# Patient Record
Sex: Male | Born: 1985
Health system: Southern US, Community
[De-identification: ages and names within clinical notes are randomized; demographics above are authoritative.]

## PROBLEM LIST (undated history)

## (undated) DIAGNOSIS — F209 Schizophrenia, unspecified: Secondary | ICD-10-CM

## (undated) HISTORY — PX: ELBOW SURGERY: SHX618

---

## 2011-09-24 DIAGNOSIS — Z9189 Other specified personal risk factors, not elsewhere classified: Secondary | ICD-10-CM | POA: Insufficient documentation

## 2011-09-24 DIAGNOSIS — Z8659 Personal history of other mental and behavioral disorders: Secondary | ICD-10-CM | POA: Insufficient documentation

## 2011-10-13 DIAGNOSIS — G723 Periodic paralysis: Secondary | ICD-10-CM | POA: Insufficient documentation

## 2011-10-13 DIAGNOSIS — R631 Polydipsia: Secondary | ICD-10-CM | POA: Insufficient documentation

## 2011-10-22 DIAGNOSIS — F200 Paranoid schizophrenia: Secondary | ICD-10-CM | POA: Insufficient documentation

## 2013-07-20 DIAGNOSIS — J00 Acute nasopharyngitis [common cold]: Secondary | ICD-10-CM | POA: Diagnosis not present

## 2014-02-14 DIAGNOSIS — Z23 Encounter for immunization: Secondary | ICD-10-CM | POA: Diagnosis not present

## 2014-07-22 DIAGNOSIS — G44219 Episodic tension-type headache, not intractable: Secondary | ICD-10-CM | POA: Diagnosis not present

## 2014-08-09 DIAGNOSIS — H04123 Dry eye syndrome of bilateral lacrimal glands: Secondary | ICD-10-CM | POA: Diagnosis not present

## 2014-12-25 ENCOUNTER — Encounter (HOSPITAL_COMMUNITY): Payer: Self-pay | Admitting: Emergency Medicine

## 2014-12-25 ENCOUNTER — Emergency Department (HOSPITAL_COMMUNITY)
Admission: EM | Admit: 2014-12-25 | Discharge: 2014-12-25 | Disposition: A | Discharge status: Home / Self Care | Payer: Medicare Other | Attending: Emergency Medicine | Admitting: Emergency Medicine

## 2014-12-25 ENCOUNTER — Emergency Department (HOSPITAL_COMMUNITY): Payer: Medicare Other

## 2014-12-25 DIAGNOSIS — S83012A Lateral subluxation of left patella, initial encounter: Secondary | ICD-10-CM | POA: Insufficient documentation

## 2014-12-25 DIAGNOSIS — Y9289 Other specified places as the place of occurrence of the external cause: Secondary | ICD-10-CM | POA: Diagnosis not present

## 2014-12-25 DIAGNOSIS — X58XXXA Exposure to other specified factors, initial encounter: Secondary | ICD-10-CM | POA: Insufficient documentation

## 2014-12-25 DIAGNOSIS — S83002A Unspecified subluxation of left patella, initial encounter: Secondary | ICD-10-CM

## 2014-12-25 DIAGNOSIS — Y998 Other external cause status: Secondary | ICD-10-CM | POA: Diagnosis not present

## 2014-12-25 DIAGNOSIS — S8992XA Unspecified injury of left lower leg, initial encounter: Secondary | ICD-10-CM | POA: Diagnosis present

## 2014-12-25 DIAGNOSIS — M25562 Pain in left knee: Secondary | ICD-10-CM

## 2014-12-25 DIAGNOSIS — Y9389 Activity, other specified: Secondary | ICD-10-CM | POA: Diagnosis not present

## 2014-12-25 MED ORDER — OXYCODONE-ACETAMINOPHEN 5-325 MG PO TABS
2.0000 | ORAL_TABLET | Freq: Once | ORAL | Status: DC
Start: 2014-12-25 — End: 2014-12-25
  Filled 2014-12-25: qty 2

## 2014-12-25 MED ORDER — HYDROCODONE-ACETAMINOPHEN 5-325 MG PO TABS: 1.0000 | ORAL_TABLET | Freq: Four times a day (QID) | ORAL | Status: DC | PRN

## 2014-12-25 NOTE — Discharge Instructions (Signed)

## 2014-12-25 NOTE — ED Provider Notes (Signed)
CSN: [id number]     Arrival date & time 12/25/14  5784 History   First MD Initiated Contact with Patient 12/25/14 [phone]     Chief Complaint  Patient presents with  . Knee Pain     (Consider location/radiation/quality/duration/timing/severity/associated sxs/prior Treatment) HPI Comments: Patient presents to the emergency department with chief complaint of left knee pain. Patient states that his kneecap disc locates from time to time. States that he rolled over in bed, and felt his knee pop. Began having pain after this incident. States he is normally able to "pop his knee back in place" but was unable to. States that the pain is moderate to severe. He is unable to ambulate. He has not taken anything to alleviate his symptoms. The pain does not radiate.  The history is provided by the patient. No language interpreter was used.    History reviewed. No pertinent past medical history. History reviewed. No pertinent past surgical history. No family history on file. History  Substance Use Topics  . Smoking status: Never Smoker   . Smokeless tobacco: Not on file  . Alcohol Use: No    Review of Systems  Constitutional: Negative for fever and chills.  Respiratory: Negative for shortness of breath.   Cardiovascular: Negative for chest pain.  Gastrointestinal: Negative for nausea, vomiting, diarrhea and constipation.  Genitourinary: Negative for dysuria.  Musculoskeletal: Positive for arthralgias.      Allergies  Bee venom  Home Medications   Prior to Admission medications   Medication Sig Start Date End Date Taking? Authorizing Provider  HYDROcodone-acetaminophen (NORCO/VICODIN) 5-325 MG per tablet Take 1-2 tablets by mouth every 6 (six) hours as needed. 12/25/14   Roxy Horseman, PA-C   BP 146/78 mmHg  Pulse 89  Temp(Src) 98.7 F (37.1 C) (Oral)  Resp 17  SpO2 99% Physical Exam  Constitutional: He is oriented to person, place, and time. He appears well-developed and  well-nourished.  HENT:  Head: Normocephalic and atraumatic.  Eyes: Conjunctivae and EOM are normal. Pupils are equal, round, and reactive to light. Right eye exhibits no discharge. Left eye exhibits no discharge. No scleral icterus.  Neck: Normal range of motion. Neck supple. No JVD present.  Cardiovascular: Normal rate, regular rhythm and normal heart sounds.  Exam reveals no gallop and no friction rub.   No murmur heard. Pulmonary/Chest: Effort normal and breath sounds normal. No respiratory distress. He has no wheezes. He has no rales. He exhibits no tenderness.  Abdominal: Soft. He exhibits no distension and no mass. There is no tenderness. There is no rebound and no guarding.  Musculoskeletal: Normal range of motion. He exhibits no edema or tenderness.  Patient is able to extend left knee with assistance, there is no obvious patella dislocation on my exam, unable to ambulate secondary to pain, no obvious bony abnormality or deformity  Neurological: He is alert and oriented to person, place, and time.  Skin: Skin is warm and dry.  Psychiatric: He has a normal mood and affect. His behavior is normal. Judgment and thought content normal.  Nursing note and vitals reviewed.   ED Course  Procedures (including critical care time) Labs Review Labs Reviewed - No data to display  Imaging Review Dg Knee Complete 4 Views Left  12/25/2014   CLINICAL DATA:  Felt last night left knee popped out of the place  EXAM: LEFT KNEE - COMPLETE 4+ VIEW  COMPARISON:  None.  FINDINGS: Four views of the left knee submitted. No acute fracture. There is  mild rotation of patella with lateral/inferior displacement suspicious for patella subluxation. Spurring of patella.  IMPRESSION: No acute fracture. There is mild rotation of patella with lateral/inferior displacement suspicious for patella subluxation. Spurring of patella.   Electronically Signed   By: Natasha Mead M.D.   On: 12/25/2014 10:29     EKG  Interpretation None      MDM   Final diagnoses:  Left knee pain  Patellar subluxation, left, initial encounter    Patient with possible patella subluxation on plain films. Also complaining of left knee pain. After giving the patient ice, because he declined pain medicine, the patient was able to extend his left knee with assistance. There is no obvious patellar dislocation. Patient seen by and discussed with Dr. Fredderick Phenix.Will discharge to home. Recommend orthopedic follow-up. Will give knee immobilizer and crutches and pain medicine.    Roxy Horseman, PA-C 12/25/14 1153  Rolan Bucco, MD 12/25/14 [phone]

## 2014-12-25 NOTE — ED Notes (Signed)
Pt states that last night he turned over and left knee popped. He states that normally he can "pop it back into place" but this time i can't.  Pt states that he cant bear weight on left leg due to the pain in knee area.

## 2014-12-25 NOTE — Progress Notes (Signed)
Orthopedic Tech Progress Note Patient Details:  Marvin Edwards June 20, 1985 [id number]  Ortho Devices Type of Ortho Device: Knee Sleeve, Knee Immobilizer, Crutches   Cammer, Mickie Bail 12/25/2014, 6:04 PM

## 2014-12-25 NOTE — ED Notes (Signed)
[facility] meds declined

## 2014-12-25 NOTE — Progress Notes (Signed)
Pt's listed medicaid [place] access response hx indicates pcp is [facility] [address] [place], [place] [address] [phone] EPIC updated  Updated pt, EDP/NP/PA and provided also a list of medicare providers within [facility] after pt states he has been in [employer] for "three years" Pt noted to be constantly in motion during CM interaction with him Cm questioned anxiety and discussed with Triage RN, Annemarie  CM noted pt with a [place] address Spoke with pt who confirms new zip code is [phone] Cm encouraged pt to update his address with ED staff prior to leaving [facility] ED

## 2014-12-27 DIAGNOSIS — M25561 Pain in right knee: Secondary | ICD-10-CM | POA: Diagnosis not present

## 2015-01-24 DIAGNOSIS — M25562 Pain in left knee: Secondary | ICD-10-CM | POA: Diagnosis not present

## 2015-04-05 ENCOUNTER — Emergency Department (HOSPITAL_COMMUNITY)
Admission: EM | Admit: 2015-04-05 | Discharge: 2015-04-05 | Disposition: A | Discharge status: Home / Self Care | Payer: Medicare Other | Attending: Emergency Medicine | Admitting: Emergency Medicine

## 2015-04-05 ENCOUNTER — Emergency Department (HOSPITAL_COMMUNITY): Payer: Medicare Other

## 2015-04-05 ENCOUNTER — Encounter (HOSPITAL_COMMUNITY): Payer: Self-pay | Admitting: Emergency Medicine

## 2015-04-05 DIAGNOSIS — F17210 Nicotine dependence, cigarettes, uncomplicated: Secondary | ICD-10-CM | POA: Diagnosis not present

## 2015-04-05 DIAGNOSIS — Z720 Tobacco use: Secondary | ICD-10-CM | POA: Insufficient documentation

## 2015-04-05 DIAGNOSIS — R0602 Shortness of breath: Secondary | ICD-10-CM | POA: Diagnosis not present

## 2015-04-05 DIAGNOSIS — Z79899 Other long term (current) drug therapy: Secondary | ICD-10-CM | POA: Diagnosis not present

## 2015-04-05 NOTE — Discharge Instructions (Signed)
There is not appear to be an emergent cause for your symptoms at this time. It is important for you to stop smoking cigarettes as this can contribute to your shortness of breath. Your chest x-ray was reassuring. Please follow-up with [facility] wellness in order to establish primary care and for reevaluation if necessary. Return to ED for any new or worsening symptoms.

## 2015-04-05 NOTE — ED Provider Notes (Signed)
CSN: [id number]     Arrival date & time 04/05/15  [id number] History   First MD Initiated Contact with Patient 04/05/15 1115     Chief Complaint  Patient presents with  . Shortness of Breath     (Consider location/radiation/quality/duration/timing/severity/associated sxs/prior Treatment) HPI Marvin Edwards is a 29 y.o. male because in for evaluation of difficulties breathing. Patient reports 2 days ago he "smoked too many cigarettes, probably 8 or 10" and this is not normal for him and he reports the next day he had more difficulty breathing. He states "I really has not had 100%, more like 80%, but it is getting better as the day goes on". He denies any associated fever, chills, cough, hemoptysis, chest pain, leg swelling, recent travel or surgeries, history of blood clot. He also reports lifting weights yesterday to help with his symptoms, which she reports seemed to help somewhat but also made his chest sore. No other aggravating or modifying factors. Patient is requesting PCP referral.  History reviewed. No pertinent past medical history. History reviewed. No pertinent past surgical history. No family history on file. Social History  Substance Use Topics  . Smoking status: Current Some Day Smoker    Types: Cigarettes  . Smokeless tobacco: None  . Alcohol Use: No    Review of Systems A 10 point review of systems was completed and was negative except for pertinent positives and negatives as mentioned in the history of present illness     Allergies  Bee venom  Home Medications   Prior to Admission medications   Medication Sig Start Date End Date Taking? Authorizing Provider  Multiple Vitamin (MULTIVITAMIN WITH MINERALS) TABS tablet Take 1 tablet by mouth daily.   Yes Historical Provider, MD  HYDROcodone-acetaminophen (NORCO/VICODIN) 5-325 MG per tablet Take 1-2 tablets by mouth every 6 (six) hours as needed. 12/25/14   Roxy Horsemanobert Browning, PA-C   BP 128/99 mmHg  Pulse 88  Temp(Src) 98.7  F (37.1 C) (Oral)  Resp 18  SpO2 100% Physical Exam  Constitutional: He is oriented to person, place, and time. He appears well-developed and well-nourished.  HENT:  Head: Normocephalic and atraumatic.  Mouth/Throat: Oropharynx is clear and moist.  Eyes: Conjunctivae are normal. Pupils are equal, round, and reactive to light. Right eye exhibits no discharge. Left eye exhibits no discharge. No scleral icterus.  Neck: Normal range of motion. Neck supple.  Cardiovascular: Normal rate, regular rhythm and normal heart sounds.   Pulmonary/Chest: Effort normal and breath sounds normal. No respiratory distress. He has no wheezes. He has no rales.  Abdominal: Soft. There is no tenderness.  Musculoskeletal: Normal range of motion. He exhibits no edema or tenderness.  Neurological: He is alert and oriented to person, place, and time.  Cranial Nerves II-XII grossly intact  Skin: Skin is warm and dry. No rash noted.  Psychiatric: He has a normal mood and affect.  Nursing note and vitals reviewed.   ED Course  Procedures (including critical care time) Labs Review Labs Reviewed - No data to display  Imaging Review Dg Chest 2 View  04/05/2015  CLINICAL DATA:  Progressive shortness of breath for past several days. EXAM: CHEST  2 VIEW COMPARISON:  None. FINDINGS: Lungs are clear. Heart size and pulmonary vascularity are normal. No adenopathy. No pneumothorax. No bone lesions. IMPRESSION: No edema or consolidation. Electronically Signed   By: Bretta BangWilliam  Woodruff III M.D.   On: 04/05/2015 11:15   I have personally reviewed and evaluated these images and lab results  as part of my medical decision-making.   EKG Interpretation None      ED ECG REPORT   Date: 04/06/2015  Rate: 84  Rhythm: normal sinus rhythm  QRS Axis: left  Intervals: normal  ST/T Wave abnormalities: nonspecific ST changes  Conduction Disutrbances:none  Narrative Interpretation:   Old EKG Reviewed: none available  I have  personally reviewed the EKG tracing and disagree with the computerized printout as noted.  Nonspecific ST changes/elevation on EKG however no clinical evidence of pericarditis.  Meds given in ED:  Medications - No data to display  Discharge Medication List as of 04/05/2015 12:40 PM     Filed Vitals:   04/05/15 1031 04/05/15 1041  BP: 128/99   Pulse: 88   Temp:  98.7 F (37.1 C)  TempSrc:  Oral  Resp: 18   SpO2: 100%     MDM  [name] is a 29 y.o. male who presents for evaluation of shortness of breath for the past 2 days. Patient reports just prior to this that he was smoking roughly 8-10 cigarettes a day, which is not normal for him. He denies any fevers, chills, cough, chest pain, numbness or weakness, leg swelling. On arrival, patient is afebrile, hemodynamically stable, nontoxic appearing and in no apparent distress. He has a benign cardiopulmonary exam. He denies any discomfort in the ED. Says that his symptoms are improving steadily. PERC negative. Chest x-ray shows no acute cardiopulmonary pathologies. EKG shows no ischemic changes. There is some suggestion of pericarditis on EKG, but this does not fit the clinical picture today. Discussed the patient he will need to stop smoking to help with his shortness of breath and he agrees with this plan. No evidence of other acute or emergent pathology. Given referral to the [facility] and wellness the patient may establish primary care. He expresses no other questions or concerns at this time. Stable for discharge Final diagnoses:  SOB (shortness of breath)        Joycie Peek, PA-C 04/06/15 0740  Tilden Fossa, MD 04/06/15 1053

## 2015-04-05 NOTE — ED Notes (Signed)
Discharge paperwork gone, patient not in room, no one stating they discharged patient. Will move patient off the floor for time being.

## 2015-04-05 NOTE — ED Notes (Signed)
Pt c/o breathing problems onset Tuesday after smoking 8 cigarettes on Tuesday. Pt in no distress at this time but states that he gets out of breath after walking too much. States on Wednesday he had some difficulty after eating, feeling as though the food "wasn't going down." Pt usually smokes some days but not every day. Repeatedly states "my breathing just isn't at 100%." Pt c/o some chest "soreness" on right side.

## 2015-05-02 ENCOUNTER — Encounter: Payer: Self-pay | Admitting: Family Medicine

## 2015-05-02 ENCOUNTER — Ambulatory Visit: Payer: Medicare Other | Attending: Family Medicine | Admitting: Family Medicine

## 2015-05-02 VITALS — BP 109/71 | HR 79 | Temp 98.1°F | Resp 18 | Ht 71.0 in | Wt 212.0 lb

## 2015-05-02 DIAGNOSIS — R0600 Dyspnea, unspecified: Secondary | ICD-10-CM

## 2015-05-02 DIAGNOSIS — F17210 Nicotine dependence, cigarettes, uncomplicated: Secondary | ICD-10-CM | POA: Insufficient documentation

## 2015-05-02 MED ORDER — ALBUTEROL SULFATE HFA 108 (90 BASE) MCG/ACT IN AERS: 2.0000 | INHALATION_SPRAY | Freq: Four times a day (QID) | RESPIRATORY_TRACT | Status: DC | PRN

## 2015-05-02 NOTE — Progress Notes (Signed)
Subjective:  Patient ID: Marvin Edwards, male    DOB: [birth year]  Age: 29 y.o. MRN: [id number]  CC: Follow-up   HPI Marvin Edwards was seen at [facility] ED a month ago with complaints of shortness of breath after he had smoked load of cigarette from her bra and he was not used to smoking. He had a chest x-ray which revealed no acute cardiopulmonary process, EKG was negative for ischemic changes. He was subsequently discharged with recommendations to follow. The clinic.  Today the patient states he is still short of breath even though his symptoms have improved but he is not yet at baseline. Whenever he breathes he feels he cannot get enough air into his lungs. Denies history of asthma or COPD and has not smoked a cigarette since his visit to the ED a month ago. Prior to that time he has smoked for 7-8 years.  Outpatient Prescriptions Prior to Visit  Medication Sig Dispense Refill  . HYDROcodone-acetaminophen (NORCO/VICODIN) 5-325 MG per tablet Take 1-2 tablets by mouth every 6 (six) hours as needed. 10 tablet 0  . Multiple Vitamin (MULTIVITAMIN WITH MINERALS) TABS tablet Take 1 tablet by mouth daily.     No facility-administered medications prior to visit.    ROS Review of Systems  Constitutional: Negative for activity change and appetite change.  HENT: Negative for sinus pressure and sore throat.   Eyes: Negative for visual disturbance.  Respiratory: Positive for shortness of breath. Negative for cough and chest tightness.   Cardiovascular: Negative for chest pain and leg swelling.  Gastrointestinal: Negative for abdominal pain, diarrhea, constipation and abdominal distention.  Endocrine: Negative.   Genitourinary: Negative for dysuria.  Musculoskeletal: Negative for myalgias and joint swelling.  Skin: Negative for rash.  Allergic/Immunologic: Negative.   Neurological: Negative for weakness, light-headedness and numbness.  Psychiatric/Behavioral: Negative for suicidal ideas  and dysphoric mood.    Objective:  BP 109/71 mmHg  Pulse 79  Temp(Src) 98.1 F (36.7 C) (Other (Comment))  Resp 18  Ht  (1.803 m)  Wt 212 lb (96.163 kg)  BMI 29.58 kg/m2  SpO2 98%  BP/Weight 05/02/2015 04/05/2015 12/25/2014  Systolic BP 109 128 133  Diastolic BP 71 99 76  Wt. (Lbs) 212 - -  BMI 29.58 - -    CLINICAL DATA: Progressive shortness of breath for past several days.  EXAM: CHEST 2 VIEW  COMPARISON: None.  FINDINGS: Lungs are clear. Heart size and pulmonary vascularity are normal. No adenopathy. No pneumothorax. No bone lesions.  IMPRESSION: No edema or consolidation.   Electronically Signed  By: Bretta Bang III M.D.  On: 04/05/2015 11:15   Physical Exam  Constitutional: He is oriented to person, place, and time. He appears well-developed and well-nourished.  Cardiovascular: Normal rate, normal heart sounds and intact distal pulses.   No murmur heard. Pulmonary/Chest: Effort normal and breath sounds normal. He has no wheezes. He has no rales. He exhibits no tenderness.  Abdominal: Soft. Bowel sounds are normal. He exhibits no distension and no mass. There is no tenderness.  Musculoskeletal: Normal range of motion.  Neurological: He is alert and oriented to person, place, and time.     Assessment & Plan:   1. Dyspnea He is saturating well on oxygen. Advised that he can take over-the-counter antihistamines. Strongly encouraged to quit smoking completely. We'll reassess at next office visit and if symptoms persist he will be referred for pulmonary function test; labs will also be ordered at that visit if needed. -  albuterol (PROVENTIL HFA;VENTOLIN HFA) 108 (90 BASE) MCG/ACT inhaler; Inhale 2 puffs into the lungs every 6 (six) hours as needed for wheezing or shortness of breath.  Dispense: 1 Inhaler; Refill: 0   Meds ordered this encounter  Medications  . albuterol (PROVENTIL HFA;VENTOLIN HFA) 108 (90 BASE) MCG/ACT inhaler     Sig: Inhale 2 puffs into the lungs every 6 (six) hours as needed for wheezing or shortness of breath.    Dispense:  1 Inhaler    Refill:  0    Follow-up: Return in about 1 month (around 06/02/2015) for follow up of dyspnea.   Jaclyn ShaggyEnobong Amao MD

## 2015-05-02 NOTE — Progress Notes (Signed)
Patients here for ED f/up for SOB.  Patient states that his breathing is at about 65% and that his breathing is not back normal.  Patient denies pain today.

## 2015-05-15 DIAGNOSIS — Z23 Encounter for immunization: Secondary | ICD-10-CM | POA: Diagnosis not present

## 2015-06-06 ENCOUNTER — Ambulatory Visit: Payer: Medicare Other | Attending: Family Medicine | Admitting: Family Medicine

## 2015-06-06 ENCOUNTER — Encounter: Payer: Self-pay | Admitting: Family Medicine

## 2015-06-06 VITALS — BP 135/96 | HR 95 | Temp 98.6°F | Resp 13 | Ht 71.0 in | Wt 217.0 lb

## 2015-06-06 DIAGNOSIS — R0600 Dyspnea, unspecified: Secondary | ICD-10-CM | POA: Insufficient documentation

## 2015-06-06 DIAGNOSIS — Z79899 Other long term (current) drug therapy: Secondary | ICD-10-CM | POA: Diagnosis not present

## 2015-06-06 DIAGNOSIS — J0121 Acute recurrent ethmoidal sinusitis: Secondary | ICD-10-CM

## 2015-06-06 DIAGNOSIS — F17210 Nicotine dependence, cigarettes, uncomplicated: Secondary | ICD-10-CM | POA: Diagnosis not present

## 2015-06-06 DIAGNOSIS — R631 Polydipsia: Secondary | ICD-10-CM | POA: Diagnosis not present

## 2015-06-06 DIAGNOSIS — J322 Chronic ethmoidal sinusitis: Secondary | ICD-10-CM | POA: Insufficient documentation

## 2015-06-06 DIAGNOSIS — F200 Paranoid schizophrenia: Secondary | ICD-10-CM | POA: Insufficient documentation

## 2015-06-06 MED ORDER — CETIRIZINE HCL 10 MG PO TABS: 10.0000 mg | ORAL_TABLET | Freq: Every day | ORAL | Status: DC

## 2015-06-06 NOTE — Progress Notes (Signed)
Patient states his breathing is some better He did not take his albuterol because he was afraid of side effects

## 2015-06-06 NOTE — Progress Notes (Signed)
Subjective:  Patient ID: Marvin Edwards, male    DOB: 02/15/1986  Age: 30 y.o. MRN: [id number]  CC: Follow-up   HPI Marvin Edwards presents for follow-up on dyspnea which he has had for the last 1 month which was related to smoking a certain kind of cigarette as per the patient and states is getting better. He was placed on Proventil HFA which he never used as he states he wanted his body to "heat itself' however he states he is not back to baseline and sometimes does not feel he is getting enough oxygen through his nostrils. He also states that whenever he does his laundry with Clorox the smell seems to bother him a bit but he denies coughing, wheezing and has quit smoking. He denies sinus pain or postnasal drip, tearing of the eyes or rhinorrhea. He also gives a history of schizophrenia for which she was previously on IM Risperdal after which she developed polydipsia which she said was a side effect of Risperdal. Previous workup at [facility] as well as endocrinology was unrevealing but he would like to have that worked up again and we have no records at this time.  Outpatient Prescriptions Prior to Visit  Medication Sig Dispense Refill  . albuterol (PROVENTIL HFA;VENTOLIN HFA) 108 (90 BASE) MCG/ACT inhaler Inhale 2 puffs into the lungs every 6 (six) hours as needed for wheezing or shortness of breath. (Patient not taking: Reported on 06/06/2015) 1 Inhaler 0  . HYDROcodone-acetaminophen (NORCO/VICODIN) 5-325 MG per tablet Take 1-2 tablets by mouth every 6 (six) hours as needed. (Patient not taking: Reported on 06/06/2015) 10 tablet 0  . Multiple Vitamin (MULTIVITAMIN WITH MINERALS) TABS tablet Take 1 tablet by mouth daily. Reported on 06/06/2015     No facility-administered medications prior to visit.    ROS Review of Systems  Constitutional: Negative for activity change and appetite change.  HENT: Negative for sinus pressure and sore throat.   Respiratory: Negative for chest  tightness, shortness of breath and wheezing.   Cardiovascular: Negative for chest pain and palpitations.  Gastrointestinal: Negative for abdominal pain, constipation and abdominal distention.  Genitourinary: Negative.   Musculoskeletal: Negative.   Psychiatric/Behavioral: Negative for behavioral problems and dysphoric mood.    Objective:  BP 135/96 mmHg  Pulse 95  Temp(Src) 98.6 F (37 C)  Resp 13  Ht 5\' 11"  (1.803 m)  Wt 217 lb (98.431 kg)  BMI 30.28 kg/m2  SpO2 99%  BP/Weight 06/06/2015 05/02/2015 04/05/2015  Systolic BP 135 109 128  Diastolic BP 96 71 99  Wt. (Lbs) 217 212 -  BMI 30.28 29.58 -      Physical Exam  Constitutional: He is oriented to person, place, and time. He appears well-developed and well-nourished.  HENT:  Right Ear: External ear normal.  Left Ear: External ear normal.  Nose: Nose normal.  Mouth/Throat: Oropharynx is clear and moist.  Cardiovascular: Normal rate, normal heart sounds and intact distal pulses.   No murmur heard. Pulmonary/Chest: Effort normal and breath sounds normal. He has no wheezes. He has no rales. He exhibits no tenderness.  Abdominal: Soft. Bowel sounds are normal. He exhibits no distension and no mass. There is no tenderness.  Musculoskeletal: Normal range of motion.  Neurological: He is alert and oriented to person, place, and time.     Assessment & Plan:   1. Dyspnea Resolved   2. Recurrent ethmoidal sinusitis, unspecified chronicity HEENT exam is normal, unexplainable etiology of current symptoms and so will treat presumptively  for sinusitis - cetirizine (ZYRTEC) 10 MG tablet; Take 1 tablet (10 mg total) by mouth daily.  Dispense: 30 tablet; Refill: 2  3. Paranoid schizophrenia (HCC) We will need to obtain records from [facility] as this could impact his physical symptoms  4. Polydipsia Advised to keep a drinking log which will be reviewed at his next visit at which time he will also have labs and urine osmolality. He is  requesting a CT scan of his head which I informed him would be in the workup for polydipsia along with labs but we will have to review his records as he had a head CT at [facility]. He will sign a medical release so we can obtain records.   Meds ordered this encounter  Medications  . cetirizine (ZYRTEC) 10 MG tablet    Sig: Take 1 tablet (10 mg total) by mouth daily.    Dispense:  30 tablet    Refill:  2    Follow-up: Return in about 3 weeks (around 06/27/2015), or if symptoms worsen or fail to improve, for follow up on polydipsia.   Jaclyn ShaggyEnobong Amao MD

## 2015-06-22 ENCOUNTER — Telehealth: Payer: Self-pay | Admitting: Family Medicine

## 2015-06-22 NOTE — Telephone Encounter (Signed)
Patient states that zyrtec has been giving him an upset stomach. Patient would like alternative for the medicine. Please follow up.

## 2015-06-27 ENCOUNTER — Ambulatory Visit: Payer: Medicare Other | Admitting: Family Medicine

## 2015-06-29 ENCOUNTER — Ambulatory Visit: Payer: Medicare Other | Admitting: Family Medicine

## 2015-07-02 NOTE — Telephone Encounter (Signed)
Loratadine OTC is the alternative

## 2015-07-04 NOTE — Telephone Encounter (Signed)
Gave patient info

## 2015-07-31 ENCOUNTER — Ambulatory Visit: Payer: Medicare Other | Attending: Family Medicine | Admitting: Family Medicine

## 2015-07-31 ENCOUNTER — Encounter: Payer: Self-pay | Admitting: Family Medicine

## 2015-07-31 VITALS — BP 145/80 | HR 82 | Temp 98.7°F | Resp 15 | Ht 71.0 in | Wt 225.0 lb

## 2015-07-31 DIAGNOSIS — R631 Polydipsia: Secondary | ICD-10-CM | POA: Diagnosis not present

## 2015-07-31 DIAGNOSIS — F209 Schizophrenia, unspecified: Secondary | ICD-10-CM | POA: Insufficient documentation

## 2015-07-31 DIAGNOSIS — F200 Paranoid schizophrenia: Secondary | ICD-10-CM

## 2015-07-31 DIAGNOSIS — Z87891 Personal history of nicotine dependence: Secondary | ICD-10-CM | POA: Insufficient documentation

## 2015-07-31 DIAGNOSIS — Z79899 Other long term (current) drug therapy: Secondary | ICD-10-CM | POA: Diagnosis not present

## 2015-07-31 DIAGNOSIS — R0982 Postnasal drip: Secondary | ICD-10-CM

## 2015-07-31 LAB — CBC WITH DIFFERENTIAL/PLATELET
Basophils Absolute: 0 10*3/uL (ref 0.0–0.1)
Basophils Relative: 0 % (ref 0–1)
Eosinophils Absolute: 0.2 10*3/uL (ref 0.0–0.7)
Eosinophils Relative: 2 % (ref 0–5)
HCT: 45.5 % (ref 39.0–52.0)
Hemoglobin: 14.8 g/dL (ref 13.0–17.0)
Lymphocytes Relative: 37 % (ref 12–46)
Lymphs Abs: 3.2 10*3/uL (ref 0.7–4.0)
MCH: 30.3 pg (ref 26.0–34.0)
MCHC: 32.5 g/dL (ref 30.0–36.0)
MCV: 93 fL (ref 78.0–100.0)
MPV: 11.3 fL (ref 8.6–12.4)
Monocytes Absolute: 0.7 10*3/uL (ref 0.1–1.0)
Monocytes Relative: 8 % (ref 3–12)
Neutro Abs: 4.6 10*3/uL (ref 1.7–7.7)
Neutrophils Relative %: 53 % (ref 43–77)
Platelets: 190 10*3/uL (ref 150–400)
RBC: 4.89 MIL/uL (ref 4.22–5.81)
RDW: 12.8 % (ref 11.5–15.5)
WBC: 8.6 10*3/uL (ref 4.0–10.5)

## 2015-07-31 MED ORDER — AZELASTINE HCL 0.1 % NA SOLN: 1.0000 | Freq: Two times a day (BID) | NASAL | Status: DC

## 2015-07-31 NOTE — Progress Notes (Signed)
Patient states he is here to follow up on his dehydration He also states the cetirizine bothered his stomach and a friend gave him azelastine nose spray that he feels is very effective

## 2015-07-31 NOTE — Progress Notes (Signed)
CC: Follow up on polydipsia  HPI: Marvin Edwards is a 30 y.o. male here today for a follow up visit. Medical history is significant for schizophrenia for which he was on IM Risperdal after which he developed polydipsia which he said was a side effect of Risperdal. Previous workup at [facility] as well as endocrinology was unrevealing and he was informed this was psychogenic which the patient disagrees with.  He was advised to keep a drinking log which he did not do but informs me he drinks 500 mls of water every 2 hours. He is requesting a prescription for Azelastine nasal spray which he obtained from his friend and seems to work better than Zyrtec for his sinuses.  Patient has No headache, No chest pain, No abdominal pain - No Nausea, No new weakness tingling or numbness, No Cough - SOB.  Allergies  Allergen Reactions  . Bee Venom Swelling   History reviewed. No pertinent past medical history. Current Outpatient Prescriptions on File Prior to Visit  Medication Sig Dispense Refill  . albuterol (PROVENTIL HFA;VENTOLIN HFA) 108 (90 BASE) MCG/ACT inhaler Inhale 2 puffs into the lungs every 6 (six) hours as needed for wheezing or shortness of breath. 1 Inhaler 0  . Multiple Vitamin (MULTIVITAMIN WITH MINERALS) TABS tablet Take 1 tablet by mouth daily. Reported on 07/31/2015     No current facility-administered medications on file prior to visit.   History reviewed. No pertinent family history. Social History   Social History  . Marital Status: Single    Spouse Name: N/A  . Number of Children: N/A  . Years of Education: N/A   Occupational History  . Not on file.   Social History Main Topics  . Smoking status: Former Smoker    Types: Cigarettes    Quit date: 05/06/2015  . Smokeless tobacco: Not on file  . Alcohol Use: No  . Drug Use: No  . Sexual Activity: Not on file   Other Topics Concern  . Not on file   Social History Narrative    Review of  Systems: Constitutional: Negative for fever, chills, diaphoresis, activity change, appetite change and fatigue. HENT: Negative for ear pain, nosebleeds, congestion, facial swelling, rhinorrhea, neck pain, neck stiffness and ear discharge.  Eyes: Negative for pain, discharge, redness, itching and visual disturbance. Respiratory: Negative for cough, choking, chest tightness, shortness of breath, wheezing and stridor.  Cardiovascular: Negative for chest pain, palpitations and leg swelling. Gastrointestinal: Negative for abdominal distention. Genitourinary: Negative for dysuria, urgency, frequency, hematuria, flank pain, decreased urine volume, difficulty urinating and dyspareunia.  Musculoskeletal: Negative for back pain, joint swelling, arthralgias and gait problem. Neurological: Negative for dizziness, tremors, seizures, syncope, facial asymmetry, speech difficulty, weakness, light-headedness, numbness and headaches.  Hematological: Negative for adenopathy. Does not bruise/bleed easily. Psychiatric/Behavioral: Negative for hallucinations, behavioral problems, confusion, dysphoric mood, decreased concentration and agitation.    Objective:   Filed Vitals:   07/31/15 1606  BP: 145/80  Pulse: 82  Temp: 98.7 F (37.1 C)  Resp: 15    Physical Exam: Constitutional: Patient appears well-developed and well-nourished. No distress. HENT: Normocephalic, atraumatic, External right and left ear normal. Oropharynx is clear and moist.  Eyes: Conjunctivae and EOM are normal. PERRLA, no scleral icterus. Neck: Normal ROM. Neck supple. No JVD. No tracheal deviation. No thyromegaly. CVS: RRR, S1/S2 +, no murmurs, no gallops, no carotid bruit.  Pulmonary: Effort and breath sounds normal, no stridor, rhonchi, wheezes, rales.  Abdominal: Soft. BS +,  no distension,  tenderness, rebound or guarding.  Musculoskeletal: Normal range of motion. No edema and no tenderness.  Lymphadenopathy: No lymphadenopathy  noted, cervical, inguinal or axillary Neuro: Alert. Normal reflexes, muscle tone coordination. No cranial nerve deficit. Skin: Skin is warm and dry. No rash noted. Not diaphoretic. No erythema. No pallor. Psychiatric: Normal mood and affect. Behavior, judgment, thought content normal.     Assessment and plan:  1. Post nasal drip Placed on Azelastin as per patient request  2. Paranoid schizophrenia (HCC) We will need to obtain records from [facility] as this could impact his physical symptoms  3. Polydipsia Advised to keep a drinking log which will be reviewed at his next visit  Refer to endocrine if symptoms persist  we will have to review his records from [facility]. He will sign a medical release so we can obtain   Jaclyn ShaggyEnobong, Amao, MD. [facility] [phone] 07/31/2015, 4:18 PM

## 2015-08-01 LAB — COMPLETE METABOLIC PANEL WITH GFR
ALT: 23 U/L (ref 9–46)
AST: 26 U/L (ref 10–40)
Albumin: 4.3 g/dL (ref 3.6–5.1)
Alkaline Phosphatase: 96 U/L (ref 40–115)
BILIRUBIN TOTAL: 0.5 mg/dL (ref 0.2–1.2)
BUN: 12 mg/dL (ref 7–25)
CALCIUM: 9.4 mg/dL (ref 8.6–10.3)
CHLORIDE: 102 mmol/L (ref 98–110)
CO2: 29 mmol/L (ref 20–31)
Creat: 1.01 mg/dL (ref 0.60–1.35)
GFR, Est African American: >89 mL/min (ref >=60)
Glucose, Bld: 84 mg/dL (ref 65–99)
Potassium: 4.5 mmol/L (ref 3.5–5.3)
SODIUM: 139 mmol/L (ref 135–146)
TOTAL PROTEIN: 7.4 g/dL (ref 6.1–8.1)

## 2015-08-01 LAB — OSMOLALITY: OSMOLALITY: 295 mosm/kg (ref 275–300)

## 2015-08-02 ENCOUNTER — Telehealth: Payer: Self-pay | Admitting: *Deleted

## 2015-08-02 NOTE — Telephone Encounter (Signed)
Results of lab work normal and patient informed

## 2015-10-08 ENCOUNTER — Ambulatory Visit (HOSPITAL_COMMUNITY)
Admission: EM | Admit: 2015-10-08 | Discharge: 2015-10-08 | Disposition: A | Discharge status: Home / Self Care | Payer: Medicare Other | Attending: Family Medicine | Admitting: Family Medicine

## 2015-10-08 ENCOUNTER — Telehealth: Payer: Self-pay | Admitting: Family Medicine

## 2015-10-08 ENCOUNTER — Encounter (HOSPITAL_COMMUNITY): Payer: Self-pay | Admitting: Emergency Medicine

## 2015-10-08 DIAGNOSIS — J45901 Unspecified asthma with (acute) exacerbation: Secondary | ICD-10-CM | POA: Diagnosis not present

## 2015-10-08 MED ORDER — ALBUTEROL SULFATE HFA 108 (90 BASE) MCG/ACT IN AERS: 2.0000 | INHALATION_SPRAY | RESPIRATORY_TRACT | Status: DC | PRN

## 2015-10-08 MED ORDER — HYDROCOD POLST-CPM POLST ER 10-8 MG/5ML PO SUER: 5.0000 mL | Freq: Every evening | ORAL | Status: DC | PRN

## 2015-10-08 NOTE — Discharge Instructions (Signed)
Bronchospasm, Adult  A bronchospasm is a spasm or tightening of the airways going into the lungs. During a bronchospasm breathing becomes more difficult because the airways get smaller. When this happens there can be coughing, a whistling sound when breathing (wheezing), and difficulty breathing. Bronchospasm is often associated with asthma, but not all patients who experience a bronchospasm have asthma.  CAUSES   A bronchospasm is caused by inflammation or irritation of the airways. The inflammation or irritation may be triggered by:   · Allergies (such as to animals, pollen, food, or mold). Allergens that cause bronchospasm may cause wheezing immediately after exposure or many hours later.    · Infection. Viral infections are believed to be the most common cause of bronchospasm.    · Exercise.    · Irritants (such as pollution, cigarette smoke, strong odors, aerosol sprays, and paint fumes).    · Weather changes. Winds increase molds and pollens in the air. Rain refreshes the air by washing irritants out. Cold air may cause inflammation.    · Stress and emotional upset.    SIGNS AND SYMPTOMS   · Wheezing.    · Excessive nighttime coughing.    · Frequent or severe coughing with a simple cold.    · Chest tightness.    · Shortness of breath.    DIAGNOSIS   Bronchospasm is usually diagnosed through a history and physical exam. Tests, such as chest X-rays, are sometimes done to look for other conditions.  TREATMENT   · Inhaled medicines can be given to open up your airways and help you breathe. The medicines can be given using either an inhaler or a nebulizer machine.  · Corticosteroid medicines may be given for severe bronchospasm, usually when it is associated with asthma.  HOME CARE INSTRUCTIONS   · Always have a plan prepared for seeking medical care. Know when to call your health care provider and local emergency services (911 in the [place] Know where you can access local emergency care.  · Only take medicines as  directed by your health care provider.  · If you were prescribed an inhaler or nebulizer machine, ask your health care provider to explain how to use it correctly. Always use a spacer with your inhaler if you were given one.  · It is necessary to remain calm during an attack. Try to relax and breathe more slowly.   · Control your home environment in the following ways:      Change your heating and air conditioning filter at least once a month.      Limit your use of fireplaces and wood stoves.    Do not smoke and do not allow smoking in your home.      Avoid exposure to perfumes and fragrances.      Get rid of pests (such as roaches and mice) and their droppings.      Throw away plants if you see mold on them.      Keep your house clean and dust free.      Replace carpet with wood, tile, or vinyl flooring. Carpet can trap dander and dust.      Use allergy-proof pillows, mattress covers, and box spring covers.      Wash bed sheets and blankets every week in hot water and dry them in a dryer.      Use blankets that are made of polyester or cotton.      Wash hands frequently.  SEEK MEDICAL CARE IF:   · You have muscle aches.    · You have chest pain.    · The sputum changes from clear or   white to yellow, green, gray, or bloody.    · The sputum you cough up gets thicker.    · There are problems that may be related to the medicine you are given, such as a rash, itching, swelling, or trouble breathing.    SEEK IMMEDIATE MEDICAL CARE IF:   · You have worsening wheezing and coughing even after taking your prescribed medicines.    · You have increased difficulty breathing.    · You develop severe chest pain.  MAKE SURE YOU:   · Understand these instructions.  · Will watch your condition.  · Will get help right away if you are not doing well or get worse.     This information is not intended to replace advice given to you by your health care provider. Make sure you discuss any questions you have with your health care  provider.     Document Released: 05/15/2003 Document Revised: 06/02/2014 Document Reviewed: 11/01/2012  Elsevier Interactive Patient Education ©2016 Elsevier Inc[employer]

## 2015-10-08 NOTE — Telephone Encounter (Signed)
Pt. Called requesting a refill on albuterol (PROVENTIL HFA;VENTOLIN HFA) 108 (90 BASE) MCG/ACT inhaler. Pt. Stated he needs to speak with the nurse today. Please f/u with pt. ASAP

## 2015-10-08 NOTE — ED Provider Notes (Signed)
CSN: [id number]     Arrival date & time 10/08/15  1357 History   First MD Initiated Contact with Patient 10/08/15 1501     Chief Complaint  Patient presents with  . Cough  . Chest Pain   (Consider location/radiation/quality/duration/timing/severity/associated sxs/prior Treatment) HPI History obtained from patient:  Pt presents with the cc of: Cough Duration of symptoms: 3 weeks Treatment prior to arrival: Albuterol Context: A nonproductive cough for 3 weeks some wheezing nonsmoker Other symptoms include: Unable to sleep due to cough Pain score: 2, ( chest pain, for cough) FAMILY HISTORY: SOCIAL HISTORY: Previous smoker  History reviewed. No pertinent past medical history. History reviewed. No pertinent past surgical history. No family history on file. Social History  Substance Use Topics  . Smoking status: Former Smoker    Types: Cigarettes    Quit date: 05/06/2015  . Smokeless tobacco: None  . Alcohol Use: No    Review of Systems ROS +'ve cough  Denies: HEADACHE, NAUSEA, ABDOMINAL PAIN, CHEST PAIN, CONGESTION, DYSURIA, SHORTNESS OF BREATH  Allergies  Bee venom  Home Medications   Prior to Admission medications   Medication Sig Start Date End Date Taking? Authorizing Provider  albuterol (PROVENTIL HFA;VENTOLIN HFA) 108 (90 Base) MCG/ACT inhaler Inhale 2 puffs into the lungs every 4 (four) hours as needed for wheezing or shortness of breath. 10/08/15   Tharon AquasFrank C Eilleen Davoli, PA  azelastine (ASTELIN) 0.1 % nasal spray Place 1 spray into both nostrils 2 (two) times daily. Use in each nostril as directed 07/31/15   Jaclyn ShaggyEnobong Amao, MD  chlorpheniramine-HYDROcodone (TUSSIONEX PENNKINETIC ER) 10-8 MG/5ML SUER Take 5 mLs by mouth at bedtime as needed for cough. 10/08/15   Tharon AquasFrank C Kendal Raffo, PA  Multiple Vitamin (MULTIVITAMIN WITH MINERALS) TABS tablet Take 1 tablet by mouth daily. Reported on 07/31/2015    Historical Provider, MD   Meds Ordered and Administered this Visit  Medications - No  data to display  BP 116/68 mmHg  Pulse 84  Temp(Src) 98.6 F (37 C) (Oral)  Resp 16  SpO2 100% No data found.   Physical Exam NURSES NOTES AND VITAL SIGNS REVIEWED. CONSTITUTIONAL: Well developed, well nourished, no acute distress HEENT: normocephalic, atraumatic EYES: Conjunctiva normal NECK:normal ROM, supple, no adenopathy PULMONARY:No respiratory distress, normal effort, few expiratory wheezes with expiration only. No crackles no consolidation. ABDOMINAL: Soft, ND, NT BS+, No CVAT MUSCULOSKELETAL: Normal ROM of all extremities,  SKIN: warm and dry without rash PSYCHIATRIC: Mood and affect, behavior are normal  ED Course  Procedures (including critical care time)  Labs Review Labs Reviewed - No data to display  Imaging Review No results found.   Visual Acuity Review  Right Eye Distance:   Left Eye Distance:   Bilateral Distance:    Right Eye Near:   Left Eye Near:    Bilateral Near:       Prescription for Tussionex and albuterol  MDM   1. Reactive airway disease with acute exacerbation     Patient is reassured that there are no issues that require transfer to higher level of care at this time or additional tests. Patient is advised to continue home symptomatic treatment. Patient is advised that if there are new or worsening symptoms to attend the emergency department, contact primary care provider, or return to [facility]. Instructions of care provided discharged home in stable condition.    THIS NOTE WAS GENERATED USING A VOICE RECOGNITION SOFTWARE PROGRAM. ALL REASONABLE EFFORTS  WERE MADE TO PROOFREAD THIS DOCUMENT FOR ACCURACY.  I have verbally reviewed the discharge instructions with the patient. A printed AVS was given to the patient.  All questions were answered prior to discharge.      Tharon Aquas, PA 10/08/15 1544

## 2015-10-08 NOTE — ED Notes (Signed)
Onset of symptoms 5/13.  Patient reports he has misplaced albuterol inhaler and needs a refill.  reports 3 day history of head congestion and runny nose and cough.  Denies fever.  Patient has a persistent cough.  Patient coughed throughout this nurse interview

## 2015-10-11 ENCOUNTER — Telehealth: Payer: Self-pay

## 2015-10-11 MED ORDER — ALBUTEROL SULFATE HFA 108 (90 BASE) MCG/ACT IN AERS: 2.0000 | INHALATION_SPRAY | RESPIRATORY_TRACT | Status: DC | PRN

## 2015-10-11 NOTE — Telephone Encounter (Signed)
Placed call to patient, patient verified name and DOB. Patient was requesting refill on his albuterol. Patient was notified that a refill will be sent to his Pharmacy for refill.

## 2015-10-26 ENCOUNTER — Ambulatory Visit (HOSPITAL_BASED_OUTPATIENT_CLINIC_OR_DEPARTMENT_OTHER): Payer: Medicare Other | Admitting: Family Medicine

## 2015-10-26 ENCOUNTER — Ambulatory Visit (HOSPITAL_COMMUNITY)
Admission: RE | Admit: 2015-10-26 | Discharge: 2015-10-26 | Disposition: A | Discharge status: Home / Self Care | Payer: Medicare Other | Source: Ambulatory Visit | Attending: Family Medicine | Admitting: Family Medicine

## 2015-10-26 ENCOUNTER — Encounter: Payer: Self-pay | Admitting: Family Medicine

## 2015-10-26 VITALS — BP 123/82 | HR 87 | Temp 98.5°F | Ht 71.0 in | Wt 235.0 lb

## 2015-10-26 DIAGNOSIS — R05 Cough: Secondary | ICD-10-CM | POA: Insufficient documentation

## 2015-10-26 DIAGNOSIS — R0982 Postnasal drip: Secondary | ICD-10-CM | POA: Diagnosis not present

## 2015-10-26 DIAGNOSIS — R059 Cough, unspecified: Secondary | ICD-10-CM

## 2015-10-26 MED ORDER — CETIRIZINE HCL 10 MG PO TABS
10.0000 mg | ORAL_TABLET | Freq: Every day | ORAL | Status: DC
Start: 2015-10-26 — End: 2016-01-29

## 2015-10-26 MED ORDER — HYDROCODONE-HOMATROPINE 5-1.5 MG/5ML PO SYRP: 5.0000 mL | ORAL_SOLUTION | Freq: Three times a day (TID) | ORAL | Status: DC | PRN

## 2015-10-26 NOTE — Patient Instructions (Signed)
Allergic Rhinitis Allergic rhinitis is when the mucous membranes in the nose respond to allergens. Allergens are particles in the air that cause your body to have an allergic reaction. This causes you to release allergic antibodies. Through a chain of events, these eventually cause you to release histamine into the blood stream. Although meant to protect the body, it is this release of histamine that causes your discomfort, such as frequent sneezing, congestion, and an itchy, runny nose.  CAUSES Seasonal allergic rhinitis (hay fever) is caused by pollen allergens that may come from grasses, trees, and weeds. Year-round allergic rhinitis (perennial allergic rhinitis) is caused by allergens such as house dust mites, pet dander, and mold spores. SYMPTOMS  Nasal stuffiness (congestion).  Itchy, runny nose with sneezing and tearing of the eyes. DIAGNOSIS Your health care provider can help you determine the allergen or allergens that trigger your symptoms. If you and your health care provider are unable to determine the allergen, skin or blood testing may be used. Your health care provider will diagnose your condition after taking your health history and performing a physical exam. Your health care provider may assess you for other related conditions, such as asthma, pink eye, or an ear infection. TREATMENT Allergic rhinitis does not have a cure, but it can be controlled by:  Medicines that block allergy symptoms. These may include allergy shots, nasal sprays, and oral antihistamines.  Avoiding the allergen. Hay fever may often be treated with antihistamines in pill or nasal spray forms. Antihistamines block the effects of histamine. There are over-the-counter medicines that may help with nasal congestion and swelling around the eyes. Check with your health care provider before taking or giving this medicine. If avoiding the allergen or the medicine prescribed do not work, there are many new medicines  your health care provider can prescribe. Stronger medicine may be used if initial measures are ineffective. Desensitizing injections can be used if medicine and avoidance does not work. Desensitization is when a patient is given ongoing shots until the body becomes less sensitive to the allergen. Make sure you follow up with your health care provider if problems continue. HOME CARE INSTRUCTIONS It is not possible to completely avoid allergens, but you can reduce your symptoms by taking steps to limit your exposure to them. It helps to know exactly what you are allergic to so that you can avoid your specific triggers. SEEK MEDICAL CARE IF:  You have a fever.  You develop a cough that does not stop easily (persistent).  You have shortness of breath.  You start wheezing.  Symptoms interfere with normal daily activities.   This information is not intended to replace advice given to you by your health care provider. Make sure you discuss any questions you have with your health care provider.   Document Released: 02/04/2001 Document Revised: 06/02/2014 Document Reviewed: 01/17/2013 Elsevier Interactive Patient Education 2016 Elsevier Inc.  

## 2015-10-26 NOTE — Progress Notes (Signed)
Subjective:  Patient ID: Marvin Edwards, male    DOB: 07/04/1985  Age: 30 y.o. MRN: [id number]  CC: persistant cough   HPI Marvin Edwards 30 year old male with a history of schizophrenia (refusing to be seen by mental health) who comes into the clinic complaining of a 4 week history of cough which was initially productive the first 2 weeks but is dry now. He was seen at urgent care for same and received a prescription for Proventil MDI and Tussionex however the latter was not covered by his insurance company. He has associated chest pains when he coughs and has tried Robitussin with no relief. Denies shortness of breath, fever or wheezing but tells me the cough is "severe" wakes him up at night.  He does have a history of postnasal drip for which I had treated him for allergic rhinitis with an antihistamine and nasal spray which she refuses to use and states he would like to see an ENT specialist "to be sure his nasal passages are clear".   Outpatient Prescriptions Prior to Visit  Medication Sig Dispense Refill  . Multiple Vitamin (MULTIVITAMIN WITH MINERALS) TABS tablet Take 1 tablet by mouth daily. Reported on 07/31/2015    . albuterol (PROVENTIL HFA;VENTOLIN HFA) 108 (90 Base) MCG/ACT inhaler Inhale 2 puffs into the lungs every 4 (four) hours as needed for wheezing or shortness of breath. (Patient not taking: Reported on 10/26/2015) 1 Inhaler 2  . azelastine (ASTELIN) 0.1 % nasal spray Place 1 spray into both nostrils 2 (two) times daily. Use in each nostril as directed (Patient not taking: Reported on 10/26/2015) 30 mL 1  . chlorpheniramine-HYDROcodone (TUSSIONEX PENNKINETIC ER) 10-8 MG/5ML SUER Take 5 mLs by mouth at bedtime as needed for cough. (Patient not taking: Reported on 10/26/2015) 75 mL 0   No facility-administered medications prior to visit.    ROS Review of Systems  Constitutional: Negative for activity change and appetite change.  HENT: Positive for postnasal drip. Negative for  sinus pressure and sore throat.   Respiratory: Positive for cough. Negative for chest tightness, shortness of breath and wheezing.   Cardiovascular: Negative for chest pain and palpitations.  Gastrointestinal: Negative for abdominal pain, constipation and abdominal distention.  Genitourinary: Negative.   Musculoskeletal: Negative.   Psychiatric/Behavioral: Negative for behavioral problems and dysphoric mood.    Objective:  BP 123/82 mmHg  Pulse 87  Temp(Src) 98.5 F (36.9 C)  Ht  (1.803 m)  Wt 235 lb (106.595 kg)  BMI 32.79 kg/m2  BP/Weight 10/26/2015 10/08/2015 07/31/2015  Systolic BP 123 116 145  Diastolic BP 82 68 80  Wt. (Lbs) 235 - 225  BMI 32.79 - 31.39      Physical Exam  Constitutional: He is oriented to person, place, and time. He appears well-developed and well-nourished.  Cardiovascular: Normal rate, normal heart sounds and intact distal pulses.   No murmur heard. Pulmonary/Chest: Effort normal and breath sounds normal. He has no wheezes. He has no rales. He exhibits no tenderness.  Abdominal: Soft. Bowel sounds are normal. He exhibits no distension and no mass. There is no tenderness.  Musculoskeletal: Normal range of motion.  Neurological: He is alert and oriented to person, place, and time.     Assessment & Plan:   1. Cough No antibiotic indicated as chest is clinically clear Placed on another antitussive and I have informed him I am unsure if his insurance company would cover antitussives. Cannot exclude seasonal allergy component and so I will place him on  an antihistamine and advised him to use his nasal spray. - DG Chest 2 View; Future - Ambulatory referral to ENT - cetirizine (ZYRTEC) 10 MG tablet; Take 1 tablet (10 mg total) by mouth daily.  Dispense: 30 tablet; Refill: 1  2. Post-nasal drip - Ambulatory referral to ENT   Meds ordered this encounter  Medications  . HYDROcodone-homatropine (HYCODAN) 5-1.5 MG/5ML syrup    Sig: Take 5 mLs by  mouth every 8 (eight) hours as needed for cough.    Dispense:  120 mL    Refill:  0  . cetirizine (ZYRTEC) 10 MG tablet    Sig: Take 1 tablet (10 mg total) by mouth daily.    Dispense:  30 tablet    Refill:  1    Follow-up: Return in about 3 months (around 01/26/2016) for coordination of care.   Jaclyn ShaggyEnobong Amao MD

## 2015-11-06 NOTE — Telephone Encounter (Signed)
Writer LVM with a msg suggesting that he pick up some OTC Robitussin and encouraged him to call back with any questions.

## 2015-11-06 NOTE — Telephone Encounter (Signed)
Pt. Called requesting a refill on HYDROcodone-homatropine (HYCODAN) 5-1.5 MG/5ML syrup.  Pt. Stated he spilled some of the medication. Please f/u with pt.

## 2015-11-06 NOTE — Telephone Encounter (Signed)
He could use over-the-counter Robitussin.

## 2015-11-07 ENCOUNTER — Ambulatory Visit: Payer: Medicare Other | Attending: Family Medicine | Admitting: *Deleted

## 2015-11-07 DIAGNOSIS — Z111 Encounter for screening for respiratory tuberculosis: Secondary | ICD-10-CM

## 2015-11-07 NOTE — Progress Notes (Signed)
Patient tolerated injection well.  Patient received VIS in hand.

## 2015-11-07 NOTE — Patient Instructions (Signed)
Please return in 48 hours for a reading of the TB test

## 2015-11-09 ENCOUNTER — Ambulatory Visit: Payer: Medicare Other | Attending: Family Medicine

## 2015-11-09 LAB — TB SKIN TEST
Induration: 0 mm
TB SKIN TEST: NEGATIVE

## 2015-11-09 NOTE — Progress Notes (Unsigned)
Pt here for TB reading  TB negative, results placed and copy given to pt

## 2015-11-10 DIAGNOSIS — R05 Cough: Secondary | ICD-10-CM | POA: Diagnosis not present

## 2016-01-29 ENCOUNTER — Encounter (HOSPITAL_COMMUNITY): Payer: Self-pay | Admitting: *Deleted

## 2016-01-29 ENCOUNTER — Ambulatory Visit (HOSPITAL_COMMUNITY)
Admission: EM | Admit: 2016-01-29 | Discharge: 2016-01-29 | Disposition: A | Discharge status: Home / Self Care | Payer: Medicare Other | Attending: Family Medicine | Admitting: Family Medicine

## 2016-01-29 DIAGNOSIS — J309 Allergic rhinitis, unspecified: Secondary | ICD-10-CM | POA: Diagnosis not present

## 2016-01-29 DIAGNOSIS — L309 Dermatitis, unspecified: Secondary | ICD-10-CM | POA: Diagnosis not present

## 2016-01-29 HISTORY — DX: Schizophrenia, unspecified: F20.9

## 2016-01-29 MED ORDER — FEXOFENADINE HCL 60 MG PO TABS: 60.0000 mg | ORAL_TABLET | Freq: Two times a day (BID) | ORAL | 6 refills | Status: DC

## 2016-01-29 MED ORDER — PREDNISONE 20 MG PO TABS: ORAL_TABLET | ORAL | 0 refills | Status: DC

## 2016-01-29 MED ORDER — TRIAMCINOLONE ACETONIDE 0.1 % EX CREA: 1.0000 "application " | TOPICAL_CREAM | Freq: Two times a day (BID) | CUTANEOUS | 2 refills | Status: DC

## 2016-01-29 NOTE — ED Provider Notes (Signed)
[facility]    CSN: [id number] Arrival date & time: 01/29/16  1136  First Provider Contact:  First MD Initiated Contact with Patient 01/29/16 1201        History   Chief Complaint Chief Complaint  Patient presents with  . Allergies  . Medication Refill    HPI Marvin Edwards is a 30 y.o. male.   This is a 30 year old man who lives with his girlfriend. He's been having difficulty with allergies over the last year. It began when he was smoking cigarettes, but he is subsequently stopped cigarette smoking.  He notes that he gets somewhat tight in his chest was exposed to fresh carpet or fresh pain. He also has chronic nasal congestion. Finally, he notes eczema in his antecubital areas.  Patient did not have allergies as a child. He has no family history of allergies.  Patient had an appointment with ear nose and throat 1 month ago but he missed the appointment. 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  He's had no fever or increase in symptoms recently. He had been taking Zyrtec but it upset his stomach and he stopped taking it. He's also had experience with nasal inhalers but he does not like using these. He has an albuterol inhaler but has not helped with the chest tightness was exposed to the paint products or new carpeting.      Past Medical History:  Diagnosis Date  . Schizophrenia [facility](HCC)     Patient Active Problem List   Diagnosis Date Noted  . Paranoid schizophrenia (HCC) 10/22/2011  . Polydipsia 10/13/2011  . Adynamia 10/13/2011  . H/O anxiety state 09/24/2011  . H/O: depression 09/24/2011  . H/O schizophrenia 09/24/2011    History reviewed. No pertinent surgical history.     Home Medications    Prior to Admission medications   Medication Sig Start Date End Date Taking? Authorizing Provider  albuterol (PROVENTIL HFA;VENTOLIN HFA) 108 (90 Base) MCG/ACT inhaler Inhale 2 puffs into the lungs every 4  (four) hours as needed for wheezing or shortness of breath. Patient not taking: Reported on 10/26/2015 10/11/15   Jaclyn ShaggyEnobong Amao, MD  fexofenadine (ALLEGRA) 60 MG tablet Take 1 tablet (60 mg total) by mouth 2 (two) times daily. 01/29/16   Elvina SidleKurt Shakirra Buehler, MD  Multiple Vitamin (MULTIVITAMIN WITH MINERALS) TABS tablet Take 1 tablet by mouth daily. Reported on 07/31/2015    Historical Provider, MD  predniSONE (DELTASONE) 20 MG tablet Two daily with food 01/29/16   Elvina SidleKurt Nessa Ramaker, MD  triamcinolone cream (KENALOG) 0.1 % Apply 1 application topically 2 (two) times daily. 01/29/16   Elvina SidleKurt Moneisha Vosler, MD    Family History History reviewed. No pertinent family history.  Social History Social History  Substance Use Topics  . Smoking status: Former Smoker    Types: Cigarettes    Quit date: 05/06/2015  . Smokeless tobacco: Never Used  . Alcohol use No     Allergies   Bee venom   Review of Systems Review of Systems  Constitutional: Negative.   HENT: Positive for congestion, rhinorrhea, sinus pressure and sneezing.   Eyes: Negative.   Respiratory: Positive for chest tightness.   Cardiovascular: Negative.   Gastrointestinal: Negative.      Physical Exam Triage Vital Signs ED Triage Vitals [01/29/16 1155]  Enc Vitals Group     BP 137/78     Pulse Rate 68     Resp 16     Temp 98 F (  36.7 C)     Temp Source Oral     SpO2 100 %     Weight      Height      Head Circumference      Peak Flow      Pain Score      Pain Loc      Pain Edu?      Excl. in GC?    No data found.   Updated Vital Signs BP 137/78 (BP Location: Left Arm)   Pulse 68   Temp 98 F (36.7 C) (Oral)   Resp 16   SpO2 100%   Visual Acuity     Physical Exam  Constitutional: He appears well-developed and well-nourished.  HENT:  Head: Normocephalic.  Nose: Nose normal.  Mouth/Throat: Oropharynx is clear and moist.  Eyes: Conjunctivae and EOM are normal. Pupils are equal, round, and reactive to light.  Neck:  Normal range of motion. Neck supple.  Cardiovascular: Normal rate, regular rhythm and normal heart sounds.   Pulmonary/Chest: Effort normal and breath sounds normal.  Musculoskeletal: Normal range of motion.  Skin:  Hyper pigmented papules and macules in both antecubital areas  Nursing note and vitals reviewed.    UC Treatments / Results  Labs (all labs ordered are listed, but only abnormal results are displayed) Labs Reviewed - No data to display  EKG  EKG Interpretation None       Radiology No results found.  Procedures Procedures (including critical care time)  Medications Ordered in UC Medications - No data to display   Initial Impression / Assessment and Plan / UC Course  I have reviewed the triage vital signs and the nursing notes.  Pertinent labs & imaging results that were available during my care of the patient were reviewed by me and considered in my medical decision making (see chart for details).  Clinical Course      Final Clinical Impressions(s) / UC Diagnoses   Final diagnoses:  Allergic rhinitis, unspecified allergic rhinitis type  Eczema    New Prescriptions New Prescriptions   FEXOFENADINE (ALLEGRA) 60 MG TABLET    Take 1 tablet (60 mg total) by mouth 2 (two) times daily.   PREDNISONE (DELTASONE) 20 MG TABLET    Two daily with food   TRIAMCINOLONE CREAM (KENALOG) 0.1 %    Apply 1 application topically 2 (two) times daily.     Elvina Sidle, MD 01/29/16 1220

## 2016-01-29 NOTE — ED Triage Notes (Signed)
Patient reports nasal congestion. No fever. Also has ran out of his prescription for Risperdal, denies any SI/HI/hallucinations.

## 2016-01-29 NOTE — Discharge Instructions (Signed)
You need to make an appointment with the ear nose and throat specialist for further evaluation

## 2016-02-14 DIAGNOSIS — H40033 Anatomical narrow angle, bilateral: Secondary | ICD-10-CM | POA: Diagnosis not present

## 2016-02-14 DIAGNOSIS — H04123 Dry eye syndrome of bilateral lacrimal glands: Secondary | ICD-10-CM | POA: Diagnosis not present

## 2016-04-22 DIAGNOSIS — J342 Deviated nasal septum: Secondary | ICD-10-CM | POA: Diagnosis not present

## 2016-04-22 DIAGNOSIS — J343 Hypertrophy of nasal turbinates: Secondary | ICD-10-CM | POA: Diagnosis not present

## 2016-04-22 DIAGNOSIS — J3089 Other allergic rhinitis: Secondary | ICD-10-CM | POA: Diagnosis not present

## 2016-04-22 DIAGNOSIS — H04123 Dry eye syndrome of bilateral lacrimal glands: Secondary | ICD-10-CM | POA: Diagnosis not present

## 2016-11-11 ENCOUNTER — Encounter: Payer: Self-pay | Admitting: Family Medicine

## 2016-11-11 ENCOUNTER — Ambulatory Visit: Payer: Medicare Other | Attending: Family Medicine | Admitting: Family Medicine

## 2016-11-11 VITALS — BP 137/83 | HR 88 | Temp 98.6°F | Resp 18 | Ht 71.0 in | Wt 245.8 lb

## 2016-11-11 DIAGNOSIS — R5383 Other fatigue: Secondary | ICD-10-CM

## 2016-11-11 DIAGNOSIS — H538 Other visual disturbances: Secondary | ICD-10-CM | POA: Diagnosis not present

## 2016-11-11 DIAGNOSIS — R42 Dizziness and giddiness: Secondary | ICD-10-CM

## 2016-11-11 DIAGNOSIS — Z79899 Other long term (current) drug therapy: Secondary | ICD-10-CM | POA: Insufficient documentation

## 2016-11-11 DIAGNOSIS — F209 Schizophrenia, unspecified: Secondary | ICD-10-CM | POA: Insufficient documentation

## 2016-11-11 NOTE — Progress Notes (Signed)
Subjective:  Patient ID: Marvin Edwards Head, male    DOB: 08/31/1985  Age: 31 y.o. 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 He pretty much lives inside of his truck and states initially that he gets adequate sleep but then a few minutes later states he does not feel well rested. Denies daytime somnolence or snoring. His last eye exam was in 01/2016. Denies headaches, nausea or vomiting.  Past Medical History:  Diagnosis Date  . Schizophrenia (HCC)     No past surgical history on file.    Outpatient Medications Prior to Visit  Medication Sig Dispense Refill  . albuterol (PROVENTIL HFA;VENTOLIN HFA) 108 (90 Base) MCG/ACT inhaler Inhale 2 puffs into the lungs every 4 (four) hours as needed for wheezing or shortness of breath. (Patient not taking: Reported on 10/26/2015) 1 Inhaler 2  . fexofenadine (ALLEGRA) 60 MG tablet Take 1 tablet (60 mg total) by mouth 2 (two) times daily. (Patient not taking: Reported on 11/11/2016) 60 tablet 6  . Multiple Vitamin (MULTIVITAMIN WITH MINERALS) TABS tablet Take 1 tablet by mouth daily. Reported on 07/31/2015    . predniSONE (DELTASONE) 20 MG tablet Two daily with food (Patient not taking: Reported on 11/11/2016) 10 tablet 0  . triamcinolone cream (KENALOG) 0.1 % Apply 1 application topically 2 (two) times daily. (Patient not taking: Reported on 11/11/2016) 453.6 g 2   No facility-administered medications prior to visit.     ROS Review of Systems  Constitutional: Negative for activity change and appetite change.  HENT: Negative for sinus pressure and sore throat.   Eyes: Negative for visual  disturbance.  Respiratory: Negative for cough, chest tightness and shortness of breath.   Cardiovascular: Negative for chest pain and leg swelling.  Gastrointestinal: Negative for abdominal distention, abdominal pain, constipation and diarrhea.  Endocrine: Negative.   Genitourinary: Negative for dysuria.  Musculoskeletal: Negative for joint swelling and myalgias.  Skin: Negative for rash.  Allergic/Immunologic: Negative.   Neurological: Negative for weakness, light-headedness and numbness.  Psychiatric/Behavioral: Negative for dysphoric mood and suicidal ideas.    Objective:  BP 137/83 (BP Location: Left Arm, Patient Position: Sitting, Cuff Size: Normal)   Pulse 88   Temp 98.6 F (37 C) (Oral)   Resp 18   Ht 5\' 11"  (1.803 m)   Wt 245 lb 12.8 oz (111.5 kg)   SpO2 98%   BMI 34.28 kg/m   BP/Weight 11/11/2016 01/29/2016 10/26/2015  Systolic BP 137 137 123  Diastolic BP 83 78 82  Wt. (Lbs) 245.8 - 235  BMI 34.28 - 32.79      Physical Exam  Constitutional: He is oriented to person, place, and time. He appears well-developed and well-nourished.  Cardiovascular: Normal rate, normal heart sounds and intact distal pulses.   No murmur heard. Pulmonary/Chest: Effort normal and breath sounds normal. He has no wheezes. He has no rales. He exhibits no tenderness.  Abdominal: Soft. Bowel sounds are normal. He exhibits no distension and no mass. There is no tenderness.  Musculoskeletal: Normal range of motion.  Neurological: He is alert and oriented to person, place, and time.  Assessment & Plan:   1. Blurry vision, bilateral Visual acuity  Need to exclude diabetes mellitus with A1c,CMET  2. Dizziness Would need to exclude anemia - CBC  3. Other fatigue -CBC, Vitamin D  On discussing the need for blood work to aid in diagnosis the patient declines blood work and is requesting a doctor's note to excuse him from work for 2 weeks so he can rest as his symptoms are as a result of  his present job. I have discussed candidly with him that at the moment I have no diagnosis and no justification at this time to excuse him from work.   No orders of the defined types were placed in this encounter.   Follow-up: Return if symptoms worsen or fail to improve.   Jaclyn Shaggy MD

## 2016-11-11 NOTE — Progress Notes (Signed)
Patient is here for blurry vision & dizziness

## 2017-04-23 ENCOUNTER — Encounter (HOSPITAL_COMMUNITY): Payer: Self-pay | Admitting: Emergency Medicine

## 2017-04-23 ENCOUNTER — Ambulatory Visit (HOSPITAL_COMMUNITY)
Admission: EM | Admit: 2017-04-23 | Discharge: 2017-04-23 | Disposition: A | Discharge status: Home / Self Care | Payer: Medicare Other | Attending: Emergency Medicine | Admitting: Emergency Medicine

## 2017-04-23 ENCOUNTER — Other Ambulatory Visit: Payer: Self-pay

## 2017-04-23 DIAGNOSIS — R05 Cough: Secondary | ICD-10-CM | POA: Diagnosis not present

## 2017-04-23 DIAGNOSIS — J9801 Acute bronchospasm: Secondary | ICD-10-CM | POA: Diagnosis not present

## 2017-04-23 DIAGNOSIS — R0982 Postnasal drip: Secondary | ICD-10-CM

## 2017-04-23 MED ORDER — PREDNISONE 50 MG PO TABS: ORAL_TABLET | ORAL | 0 refills | Status: DC

## 2017-04-23 MED ORDER — ALBUTEROL SULFATE HFA 108 (90 BASE) MCG/ACT IN AERS: 2.0000 | INHALATION_SPRAY | RESPIRATORY_TRACT | 0 refills | Status: DC | PRN

## 2017-04-23 MED ORDER — IPRATROPIUM-ALBUTEROL 0.5-2.5 (3) MG/3ML IN SOLN
RESPIRATORY_TRACT | Status: AC
  Filled 2017-04-23: qty 3

## 2017-04-23 MED ORDER — PREDNISONE 20 MG PO TABS
60.0000 mg | ORAL_TABLET | Freq: Once | ORAL | Status: AC
  Administered 2017-04-23: 60 mg via ORAL

## 2017-04-23 MED ORDER — IPRATROPIUM-ALBUTEROL 0.5-2.5 (3) MG/3ML IN SOLN
3.0000 mL | Freq: Once | RESPIRATORY_TRACT | Status: AC
  Administered 2017-04-23: 3 mL via RESPIRATORY_TRACT

## 2017-04-23 MED ORDER — PREDNISONE 20 MG PO TABS
ORAL_TABLET | ORAL | Status: AC
  Filled 2017-04-23: qty 3

## 2017-04-23 NOTE — Discharge Instructions (Signed)
Use the albuterol inhaler 2 puffs every 4 hours as needed for cough or wheeze, takes the prednisone once a starting tomorrow. Also take Allegra 180 mg once a day to minimize drainage in the back of your throat. Drink plenty of water

## 2017-04-23 NOTE — ED Triage Notes (Signed)
Pt c/o cough x2 months, with chest congestion and chest soreness. Pt states hes producing a lot of mucous.

## 2017-04-23 NOTE — ED Provider Notes (Signed)
[facility]    CSN: [id number] Arrival date & time: 04/23/17  1807     History   Chief Complaint Chief Complaint  Patient presents with  . Cough    HPI Marvin Edwards is a 31 y.o. male.   31 year old male with a cough for 2 months. In the past 3 weeks he has noticed an increase amount of clear mucus. He does have a history of smoking in the past. He occasionally has shortness of breath. Denies fever or chills. Denies earache. Denies sore throat.      Past Medical History:  Diagnosis Date  . Schizophrenia [facility](HCC)     Patient Active Problem List   Diagnosis Date Noted  . Paranoid schizophrenia (HCC) 10/22/2011  . Polydipsia 10/13/2011  . Adynamia 10/13/2011  . H/O anxiety state 09/24/2011  . H/O: depression 09/24/2011  . H/O schizophrenia 09/24/2011    History reviewed. No pertinent surgical history.     Home Medications    Prior to Admission medications   Medication Sig Start Date End Date Taking? Authorizing Provider  albuterol (PROVENTIL HFA;VENTOLIN HFA) 108 (90 Base) MCG/ACT inhaler Inhale 2 puffs into the lungs every 4 (four) hours as needed for wheezing or shortness of breath. 04/23/17   Marvin Edwards, Marvin Riner, Marvin Edwards  Multiple Vitamin (MULTIVITAMIN WITH MINERALS) TABS tablet Take 1 tablet by mouth daily. Reported on 07/31/2015    [provider]  predniSONE (DELTASONE) 50 MG tablet 1 tab po daily for 6 days. Take with food. Start 11/30.2018. 04/23/17   Marvin Edwards, Marvin Yoon, Marvin Edwards    Family History No family history on file.  Social History Social History   Tobacco Use  . Smoking status: Former Smoker    Types: Cigarettes    Last attempt to quit: 05/06/2015    Years since quitting: 1.9  . Smokeless tobacco: Never Used  Substance Use Topics  . Alcohol use: No  . Drug use: No     Allergies   Bee venom   Review of Systems Review of Systems  Constitutional: Negative.   Respiratory: Positive for cough. Negative for shortness of breath.     Cardiovascular: Positive for chest pain.       Anterior right chest wall pain associated with cough  Gastrointestinal: Negative.   Skin: Negative.   Neurological: Negative.   All other systems reviewed and are negative.    Physical Exam Triage Vital Signs ED Triage Vitals [04/23/17 1827]  Enc Vitals Group     BP (!) 135/52     Pulse Rate 88     Resp 16     Temp 98.4 F (36.9 C)     Temp src      SpO2 100 %     Weight      Height      Head Circumference      Peak Flow      Pain Score      Pain Loc      Pain Edu?      Excl. in GC?    No data found.  Updated Vital Signs BP (!) 135/52   Pulse 88   Temp 98.4 F (36.9 C)   Resp 16   SpO2 100%   Visual Acuity Right Eye Distance:   Left Eye Distance:   Bilateral Distance:    Right Eye Near:   Left Eye Near:    Bilateral Near:     Physical Exam  Constitutional: He is oriented to person, place, and time. He appears  well-developed and well-nourished. No distress.  HENT:  Posterior pharynx with light erythema and light cobblestoning.  Eyes: EOM are normal.  Neck: Normal range of motion. Neck supple.  Cardiovascular: Normal rate, regular rhythm, normal heart sounds and intact distal pulses.  Pulmonary/Chest: Effort normal. No respiratory distress. He exhibits tenderness.  Prolonged expiratory phase. With forced expiration light coarseness bilaterally.  Musculoskeletal: Normal range of motion. He exhibits no edema.  Lymphadenopathy:    He has no cervical adenopathy.  Neurological: He is alert and oriented to person, place, and time. He exhibits normal muscle tone.  Skin: Skin is warm and dry.  Psychiatric: He has a normal mood and affect.  Nursing note and vitals reviewed.    UC Treatments / Results  Labs (all labs ordered are listed, but only abnormal results are displayed) Labs Reviewed - No data to display  EKG  EKG Interpretation None       Radiology No results found.  Procedures Procedures  (including critical care time)  Medications Ordered in UC Medications  ipratropium-albuterol (DUONEB) 0.5-2.5 (3) MG/3ML nebulizer solution 3 mL (3 mLs Nebulization Given 04/23/17 1935)  predniSONE (DELTASONE) tablet 60 mg (60 mg Oral Given 04/23/17 1935)     Initial Impression / Assessment and Plan / UC Course  I have reviewed the triage vital signs and the nursing notes.  Pertinent labs & imaging results that were available during my care of the patient were reviewed by me and considered in my medical decision making (see chart for details).    Use the albuterol inhaler 2 puffs every 4 hours as needed for cough or wheeze, takes the prednisone once a starting tomorrow. Also take Allegra 180 mg once a day to minimize drainage in the back of your throat. Drink plenty of water  Post DuoNeb the patient states he can take a deeper breath. Lungs are clear.  Final Clinical Impressions(s) / UC Diagnoses   Final diagnoses:  Cough due to bronchospasm  PND (post-nasal drip)    ED Discharge Orders        Ordered    albuterol (PROVENTIL HFA;VENTOLIN HFA) 108 (90 Base) MCG/ACT inhaler  Every 4 hours PRN     04/23/17 2004    predniSONE (DELTASONE) 50 MG tablet     04/23/17 2004       Controlled Substance Prescriptions [place] Controlled Substance Registry consulted? Not Applicable   Marvin Edwards, Marvin Vendetti, Marvin Edwards 04/23/17 2005

## 2017-05-23 DIAGNOSIS — H04123 Dry eye syndrome of bilateral lacrimal glands: Secondary | ICD-10-CM | POA: Diagnosis not present

## 2017-06-15 IMAGING — CR DG CHEST 2V
2 series · 2 of 2 positions shown · non-contrast
Comparison: April 05, 2015

CLINICAL DATA: Cough for 4 weeks

EXAM:
CHEST  2 VIEW

[w chest pa]
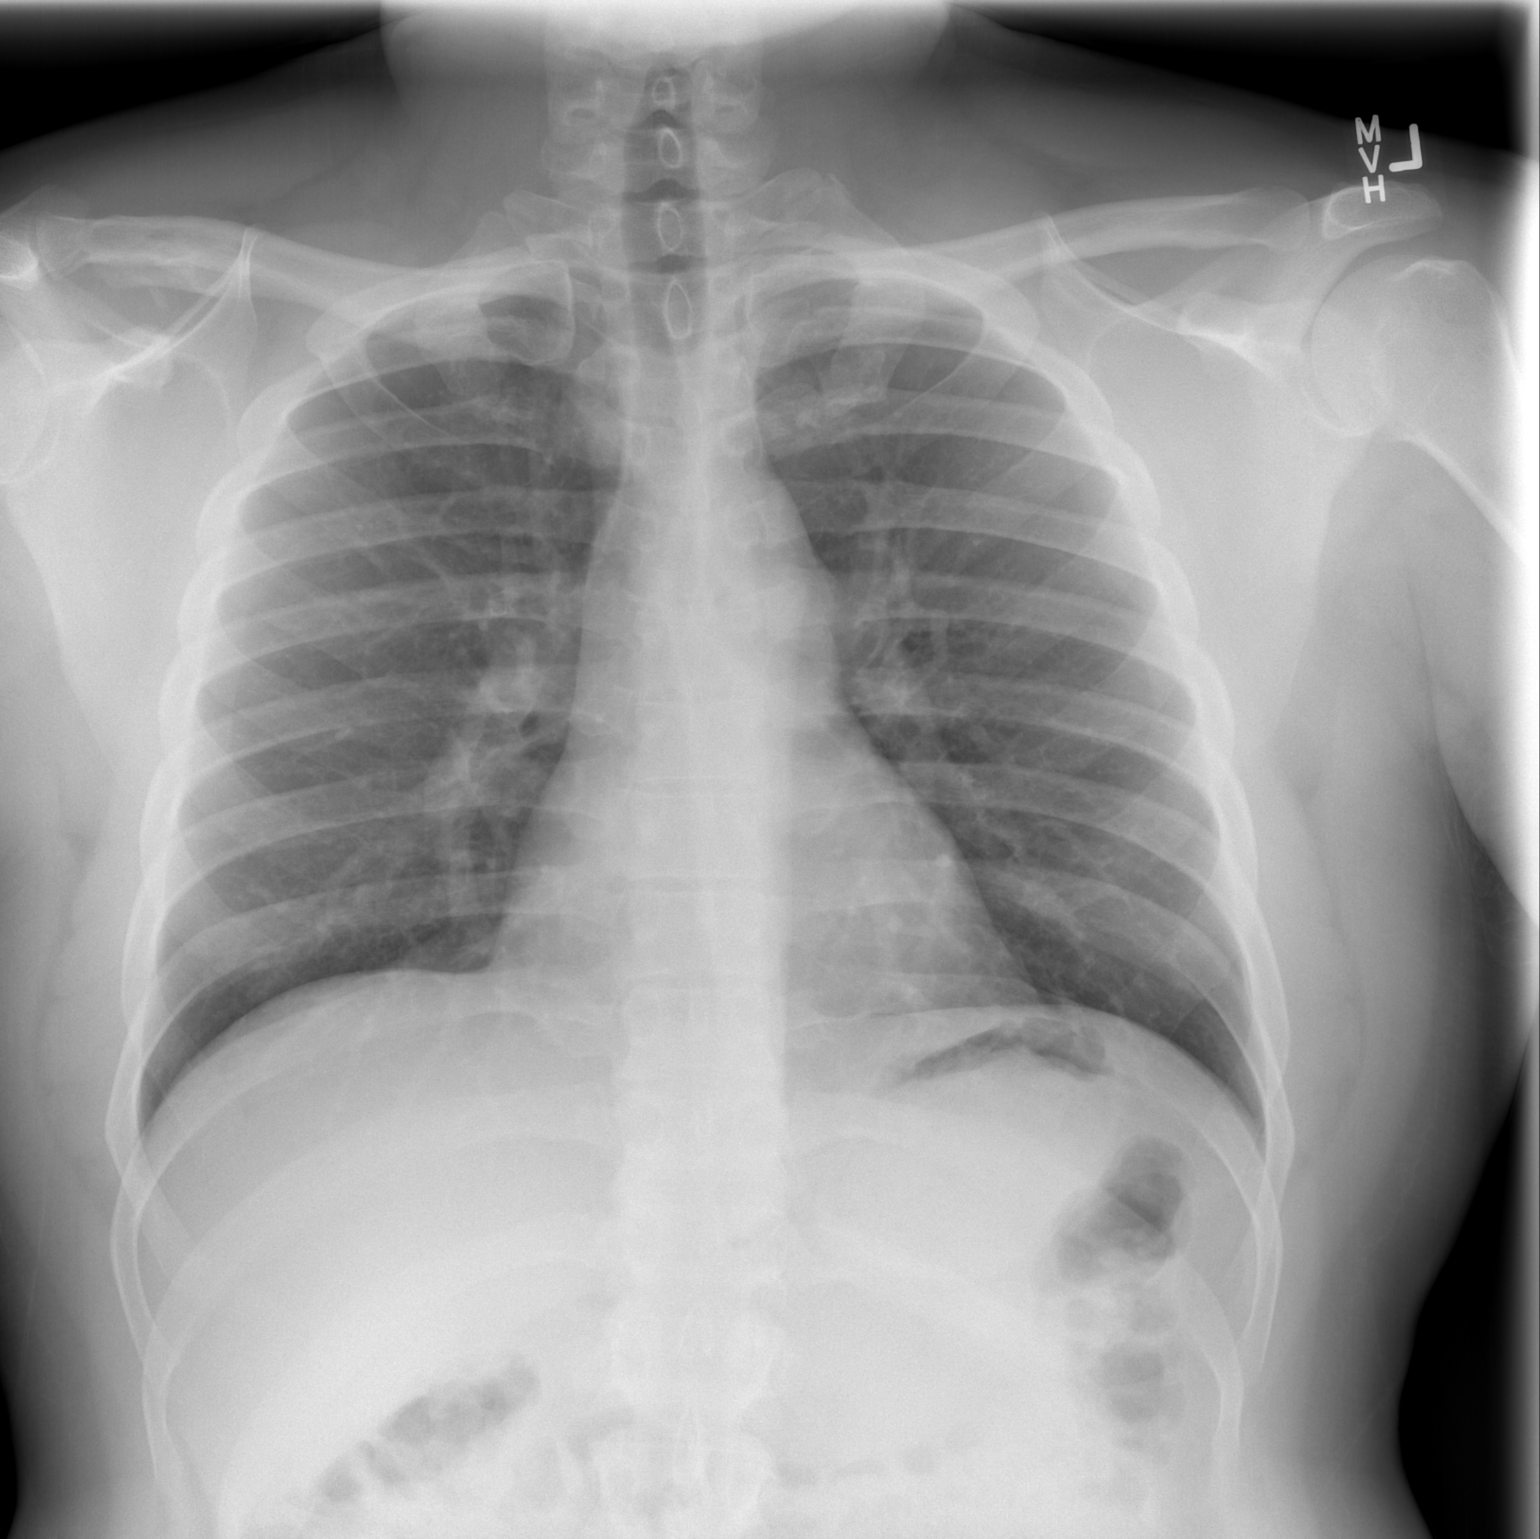

[w chest lat]
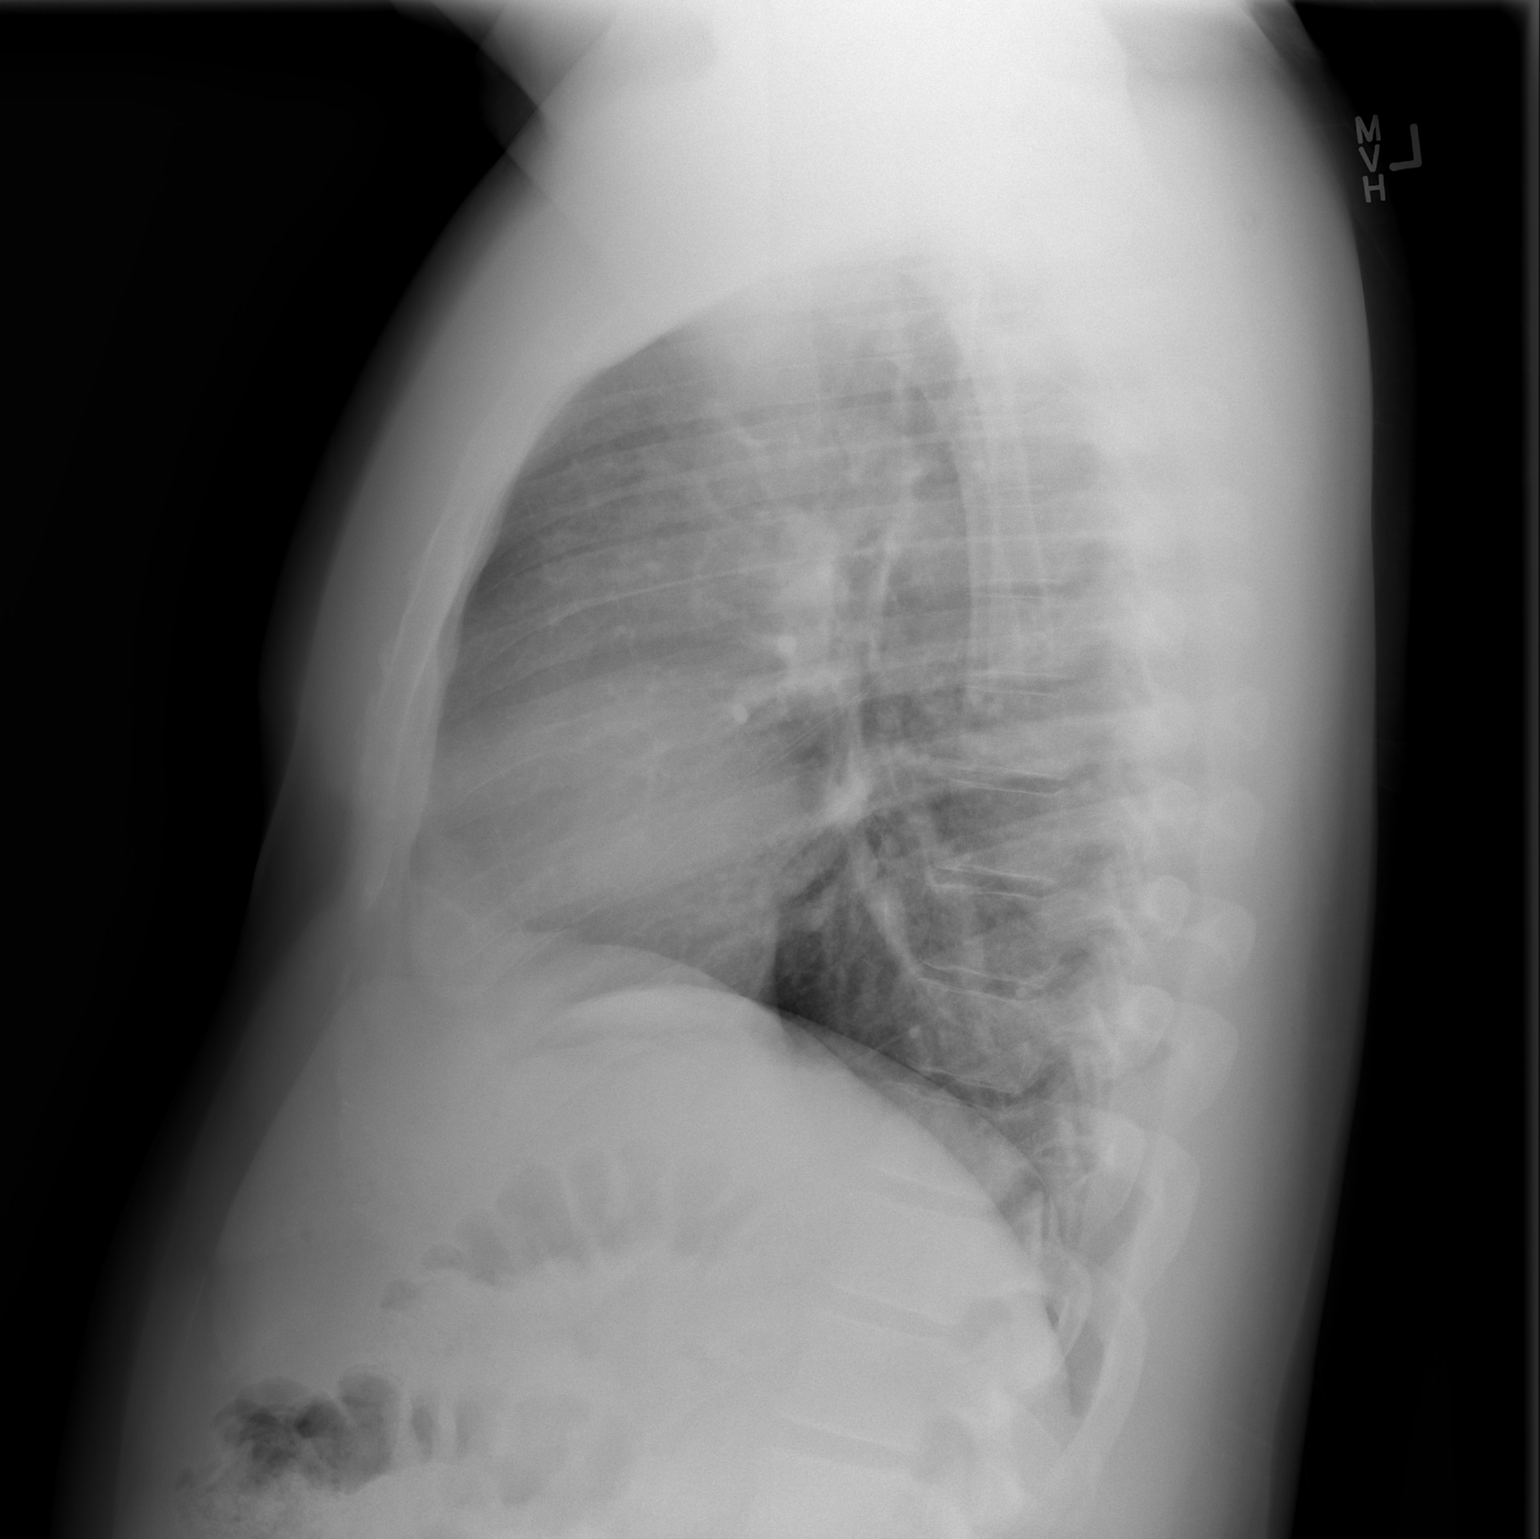

[2 of 2 positions shown; findings below may reference images not displayed]

FINDINGS: Lungs are clear. Heart size and pulmonary vascularity are normal. No
adenopathy. No bone lesions.
IMPRESSION: No edema or consolidation.

## 2018-06-05 ENCOUNTER — Ambulatory Visit (HOSPITAL_COMMUNITY)
Admission: EM | Admit: 2018-06-05 | Discharge: 2018-06-05 | Disposition: A | Discharge status: Home / Self Care | Payer: Medicare Other | Attending: Family Medicine | Admitting: Family Medicine

## 2018-06-05 ENCOUNTER — Encounter (HOSPITAL_COMMUNITY): Payer: Self-pay | Admitting: *Deleted

## 2018-06-05 ENCOUNTER — Other Ambulatory Visit: Payer: Self-pay

## 2018-06-05 ENCOUNTER — Ambulatory Visit (INDEPENDENT_AMBULATORY_CARE_PROVIDER_SITE_OTHER): Payer: Medicare Other

## 2018-06-05 DIAGNOSIS — R0789 Other chest pain: Secondary | ICD-10-CM

## 2018-06-05 DIAGNOSIS — R05 Cough: Secondary | ICD-10-CM | POA: Diagnosis not present

## 2018-06-05 DIAGNOSIS — R0981 Nasal congestion: Secondary | ICD-10-CM | POA: Insufficient documentation

## 2018-06-05 DIAGNOSIS — J069 Acute upper respiratory infection, unspecified: Secondary | ICD-10-CM

## 2018-06-05 DIAGNOSIS — R079 Chest pain, unspecified: Secondary | ICD-10-CM | POA: Diagnosis not present

## 2018-06-05 MED ORDER — IBUPROFEN 800 MG PO TABS: 800.0000 mg | ORAL_TABLET | Freq: Three times a day (TID) | ORAL | 0 refills | Status: DC

## 2018-06-05 MED ORDER — BENZONATATE 200 MG PO CAPS: 200.0000 mg | ORAL_CAPSULE | Freq: Two times a day (BID) | ORAL | 0 refills | Status: DC | PRN

## 2018-06-05 MED ORDER — IPRATROPIUM BROMIDE 0.06 % NA SOLN: 2.0000 | Freq: Four times a day (QID) | NASAL | 0 refills | Status: DC

## 2018-06-05 NOTE — Discharge Instructions (Addendum)
Drink plenty of fluids Take Tessalon for cough Use a nasal spray to help with the nasal drainage You may take ibuprofen 3 times a day.  Take with food.  This is for pain and fever Get plenty of rest Return if you fail to improve in the next few days

## 2018-06-05 NOTE — ED Notes (Signed)
EKG shown to H. Wieters, PA. 

## 2018-06-05 NOTE — ED Provider Notes (Signed)
[facility]    CSN: [id number] Arrival date & time: 06/05/18  1048     History   Chief Complaint Chief Complaint  Patient presents with  . Chest Pain  . Nasal Congestion    HPI Marvin Edwards is a 33 y.o. male.   HPI  Patient states that he has had an upper respiratory infection for over a week.  He states that he has had copious clear rhinorrhea and sinus congestion.  Some sore throat.  Coughing.  Upper chest congestion.  He states that last night he had a vomiting spell.  He vomited forcefully 3 or more times.  After this he started noting chest pain.  It is in the left anterior chest.  Worse with deep breath.  No fever or chills.  No chest pain with activity.  No shortness of breath or wheezing.  No underlying lung disease or asthma.  Was given inhaler once for an upper respiratory infection last year.  He does not have any cardiac risk factors that he knows of, no hypertension no diabetes no family history.  He smoked briefly years ago but has not for many years.  No radiation of pain.  The pain is sharp.  Patient states today he does not have any more nausea.  Past Medical History:  Diagnosis Date  . Schizophrenia [facility])     Patient Active Problem List   Diagnosis Date Noted  . Paranoid schizophrenia (HCC) 10/22/2011  . Polydipsia 10/13/2011  . Adynamia 10/13/2011  . H/O anxiety state 09/24/2011  . H/O: depression 09/24/2011  . H/O schizophrenia 09/24/2011    Past Surgical History:  Procedure Laterality Date  . ELBOW SURGERY         Home Medications    Prior to Admission medications   Medication Sig Start Date End Date Taking? Authorizing Provider  benzonatate (TESSALON) 200 MG capsule Take 1 capsule (200 mg total) by mouth 2 (two) times daily as needed for cough. 06/05/18   Eustace Moore, MD  ibuprofen (ADVIL,MOTRIN) 800 MG tablet Take 1 tablet (800 mg total) by mouth 3 (three) times daily. 06/05/18   Eustace Moore, MD  ipratropium  (ATROVENT) 0.06 % nasal spray Place 2 sprays into both nostrils 4 (four) times daily. 06/05/18   Eustace Moore, MD  Multiple Vitamin (MULTIVITAMIN WITH MINERALS) TABS tablet Take 1 tablet by mouth daily. Reported on 07/31/2015    [provider]    Family History Family History  Problem Relation Age of Onset  . Healthy Mother   . Healthy Father     Social History Social History   Tobacco Use  . Smoking status: Former Smoker    Types: Cigarettes    Last attempt to quit: 05/06/2015    Years since quitting: 3.0  . Smokeless tobacco: Never Used  Substance Use Topics  . Alcohol use: No  . Drug use: Not Currently     Allergies   Bee venom   Review of Systems Review of Systems  Constitutional: Negative for chills and fever.  HENT: Positive for congestion, dental problem, postnasal drip, rhinorrhea and sinus pressure. Negative for ear pain and sore throat.   Eyes: Negative for pain and visual disturbance.  Respiratory: Positive for cough. Negative for shortness of breath.   Cardiovascular: Positive for chest pain. Negative for palpitations.  Gastrointestinal: Negative for abdominal pain and vomiting.  Genitourinary: Negative for dysuria and hematuria.  Musculoskeletal: Negative for arthralgias and back pain.  Skin: Negative for color  change and rash.  Neurological: Negative for seizures and syncope.  All other systems reviewed and are negative.    Physical Exam Triage Vital Signs ED Triage Vitals  Enc Vitals Group     BP 06/05/18 1137 137/69     Pulse Rate 06/05/18 1135 87     Resp 06/05/18 1135 20     Temp 06/05/18 1135 98 F (36.7 C)     Temp Source 06/05/18 1135 Temporal     SpO2 06/05/18 1135 100 %     Weight --      Height --      Head Circumference --      Peak Flow --      Pain Score 06/05/18 1137 4     Pain Loc --      Pain Edu? --      Excl. in GC? --    No data found.  Updated Vital Signs BP 137/69   Pulse 87   Temp 98 F (36.7 C)  (Temporal)   Resp 20   SpO2 100%       Physical Exam Constitutional:      General: He is not in acute distress.    Appearance: He is well-developed. He is not ill-appearing or diaphoretic.  HENT:     Head: Normocephalic and atraumatic.  Eyes:     Conjunctiva/sclera: Conjunctivae normal.     Pupils: Pupils are equal, round, and reactive to light.  Neck:     Musculoskeletal: Normal range of motion.  Cardiovascular:     Rate and Rhythm: Normal rate and regular rhythm.  No extrasystoles are present.    Chest Wall: PMI is not displaced.     Heart sounds: Normal heart sounds.  Pulmonary:     Effort: Pulmonary effort is normal. No respiratory distress.     Breath sounds: Normal breath sounds.     Comments: Mild chest wall tenderness to the left of the sternum Abdominal:     General: Bowel sounds are normal. There is no distension.     Palpations: Abdomen is soft.  Musculoskeletal: Normal range of motion.  Lymphadenopathy:     Cervical: No cervical adenopathy.  Skin:    General: Skin is warm and dry.  Neurological:     General: No focal deficit present.     Mental Status: He is alert.  Psychiatric:        Mood and Affect: Mood normal.        Behavior: Behavior normal.      UC Treatments / Results  Labs (all labs ordered are listed, but only abnormal results are displayed) Labs Reviewed - No data to display  EKG EKG performed, normal sinus rhythm.  Normal rate.  No intervals.  No ST changes None  Radiology Dg Chest 2 View  Result Date: 06/05/2018 CLINICAL DATA:  Productive cough and chest pain for 1 week. Vomiting. EXAM: CHEST - 2 VIEW COMPARISON:  10/26/2015 FINDINGS: The heart size and mediastinal contours are within normal limits. Both lungs are clear. The visualized skeletal structures are unremarkable. IMPRESSION: No active cardiopulmonary disease. Electronically Signed   By: Myles RosenthalJohn  Stahl M.D.   On: 06/05/2018 12:43    Procedures Procedures (including critical  care time)  Medications Ordered in UC Medications - No data to display  Initial Impression / Assessment and Plan / UC Course  I have reviewed the triage vital signs and the nursing notes.  Pertinent labs & imaging results that were available during my care  of the patient were reviewed by me and considered in my medical decision making (see chart for details).     Discussed with the patient that he had a viral upper respiratory infection.  See any indication for antibiotics.  I do believe that his chest pain is chest wall pain from protracted vomiting.  He does have tenderness to palpation.  EKG and chest x-ray are normal.  Symptomatic care is discussed. Final Clinical Impressions(s) / UC Diagnoses   Final diagnoses:  Nasal congestion  Chest wall pain  Viral upper respiratory tract infection     Discharge Instructions     Drink plenty of fluids Take Tessalon for cough Use a nasal spray to help with the nasal drainage You may take ibuprofen 3 times a day.  Take with food.  This is for pain and fever Get plenty of rest Return if you fail to improve in the next few days    ED Prescriptions    Medication Sig Dispense Auth. Provider   ibuprofen (ADVIL,MOTRIN) 800 MG tablet Take 1 tablet (800 mg total) by mouth 3 (three) times daily. 21 tablet Eustace MooreNelson,  Sue, MD   benzonatate (TESSALON) 200 MG capsule Take 1 capsule (200 mg total) by mouth 2 (two) times daily as needed for cough. 20 capsule Eustace MooreNelson,  Sue, MD   ipratropium (ATROVENT) 0.06 % nasal spray Place 2 sprays into both nostrils 4 (four) times daily. 15 mL Eustace MooreNelson,  Sue, MD     Controlled Substance Prescriptions [place] Controlled Substance Registry consulted? Not Applicable   Eustace MooreNelson,  Sue, MD 06/05/18 [phone]

## 2018-06-05 NOTE — ED Triage Notes (Addendum)
C/O significant nasal drainage and facial pressure.  Had emesis x 1 yesterday.  C/O body aches.  Denies cough.  "I've been having left chest and back pains around my heart area, immediately after I threw up yesterday". Continues with some chest discomfort.  States discomfort worse with deep breathing.

## 2019-04-30 DIAGNOSIS — H04123 Dry eye syndrome of bilateral lacrimal glands: Secondary | ICD-10-CM | POA: Diagnosis not present

## 2019-04-30 DIAGNOSIS — H40033 Anatomical narrow angle, bilateral: Secondary | ICD-10-CM | POA: Diagnosis not present

## 2020-01-17 ENCOUNTER — Other Ambulatory Visit: Payer: Self-pay

## 2020-01-17 ENCOUNTER — Other Ambulatory Visit: Payer: Medicare Other

## 2020-01-17 DIAGNOSIS — Z20822 Contact with and (suspected) exposure to covid-19: Secondary | ICD-10-CM

## 2020-01-19 LAB — NOVEL CORONAVIRUS, NAA: SARS-CoV-2, NAA: NOT DETECTED

## 2020-01-19 LAB — SARS-COV-2, NAA 2 DAY TAT

## 2020-01-24 IMAGING — DX DG CHEST 2V
2 series · 2 of 2 positions shown · non-contrast
Comparison: 10/26/2015

CLINICAL DATA: Productive cough and chest pain for 1 week.
Vomiting.

EXAM:
CHEST - 2 VIEW

[chest pa]
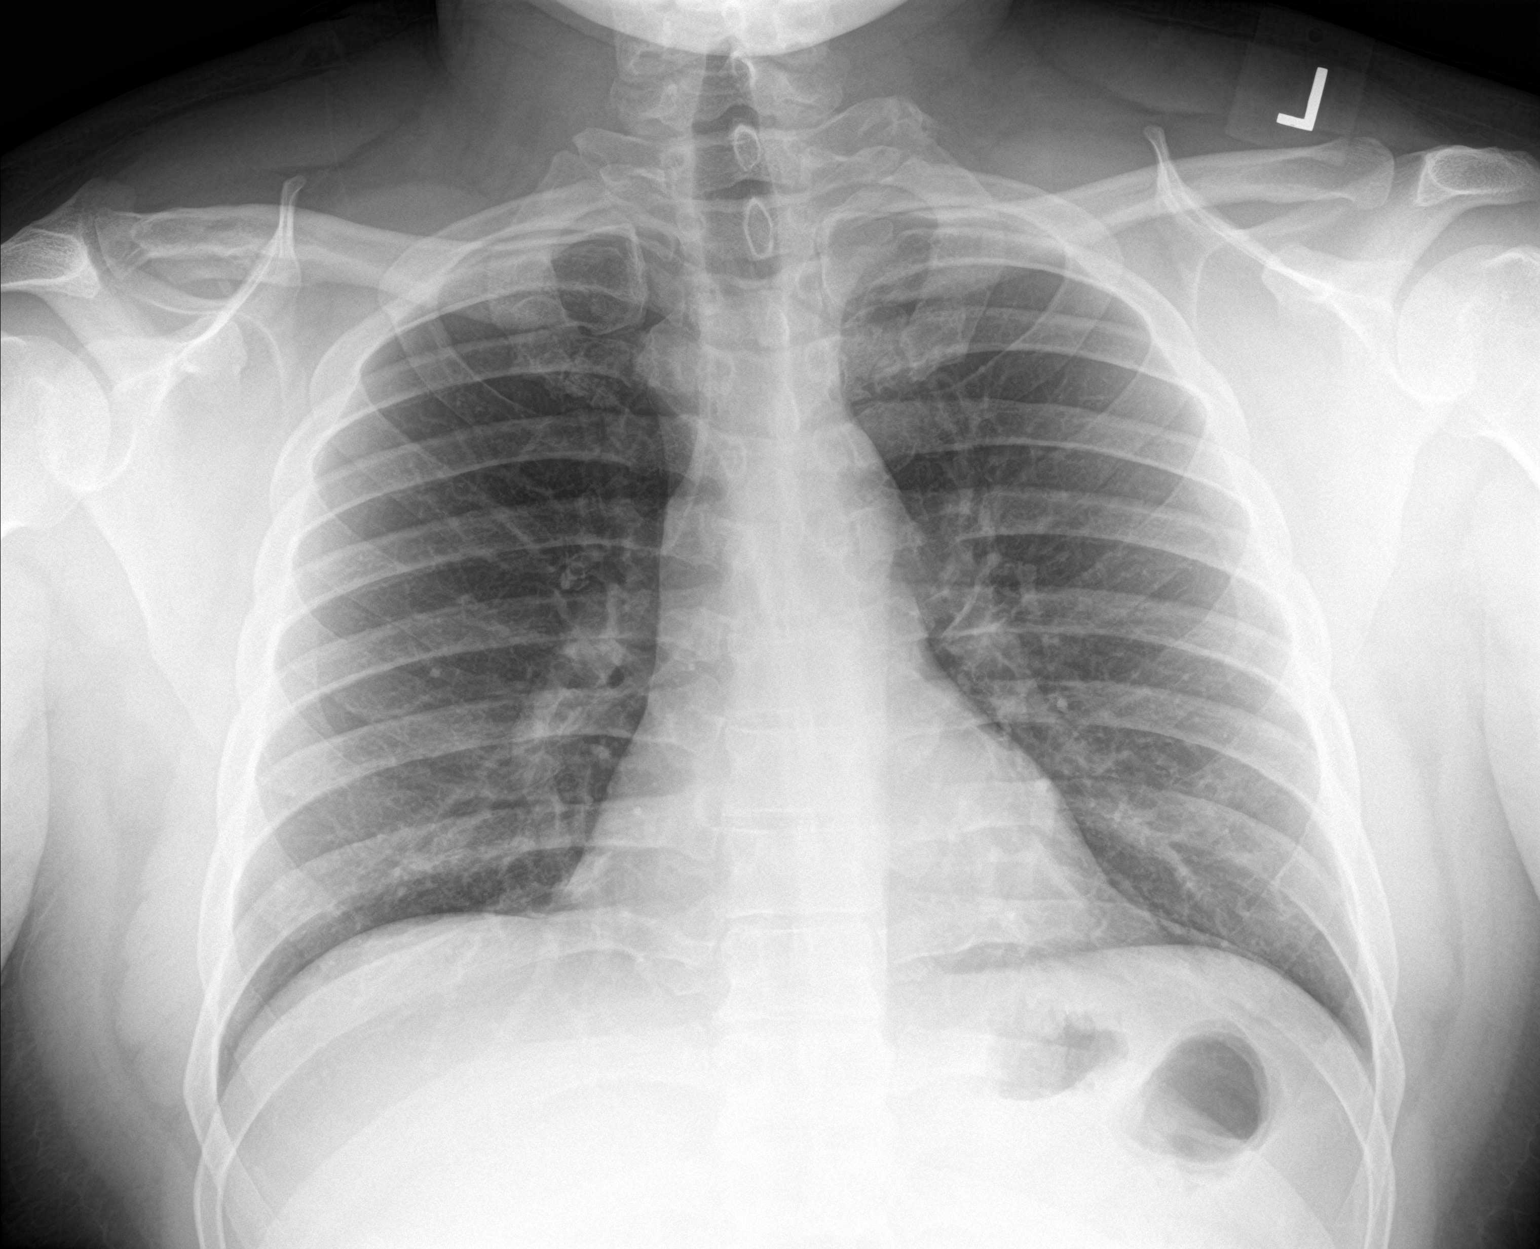

[chest lat]
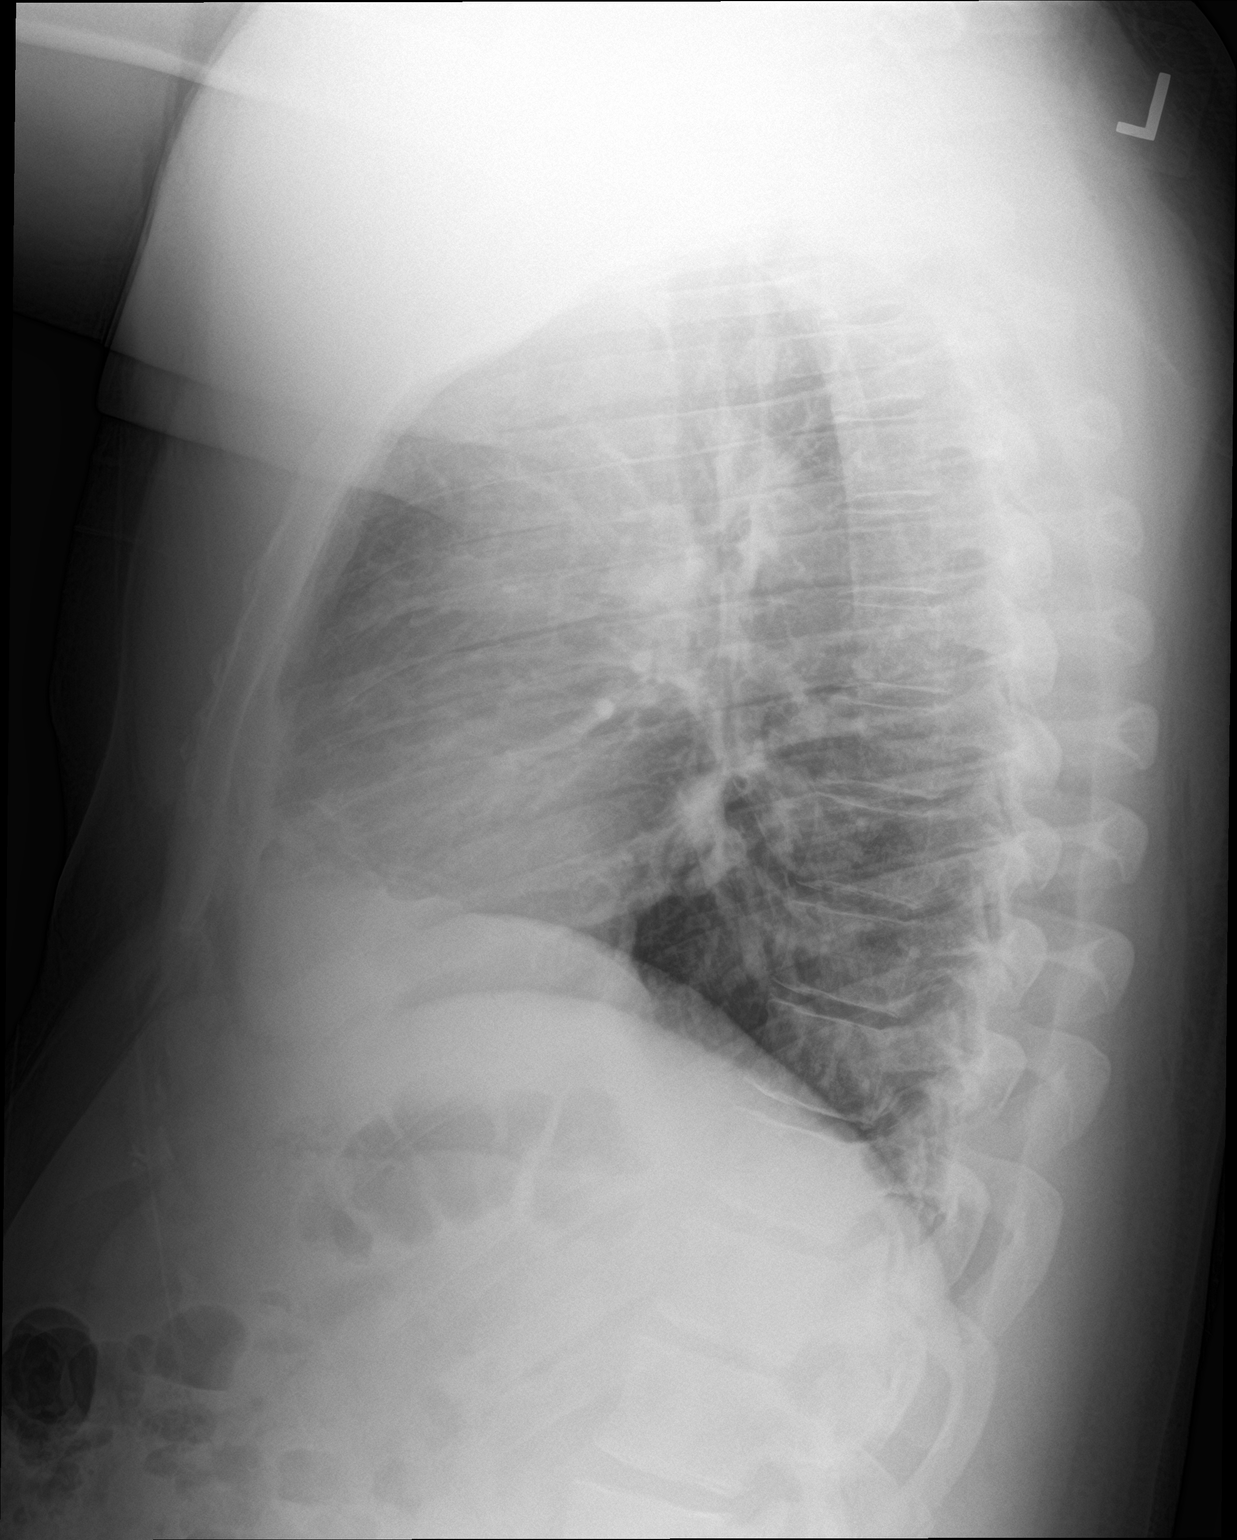

[2 of 2 positions shown; findings below may reference images not displayed]

FINDINGS: The heart size and mediastinal contours are within normal limits.
Both lungs are clear. The visualized skeletal structures are
unremarkable.
IMPRESSION: No active cardiopulmonary disease.

## 2020-03-07 ENCOUNTER — Ambulatory Visit: Payer: Medicare Other | Admitting: Family Medicine

## 2023-09-16 ENCOUNTER — Ambulatory Visit (HOSPITAL_COMMUNITY)
Admission: RE | Admit: 2023-09-16 | Discharge: 2023-09-16 | Disposition: A | Discharge status: Home / Self Care | Source: Ambulatory Visit | Attending: Family Medicine | Admitting: Family Medicine

## 2023-09-16 ENCOUNTER — Encounter (HOSPITAL_COMMUNITY): Payer: Self-pay

## 2023-09-16 VITALS — BP 122/80 | HR 79 | Temp 97.7°F | Resp 16

## 2023-09-16 DIAGNOSIS — K0889 Other specified disorders of teeth and supporting structures: Secondary | ICD-10-CM | POA: Diagnosis not present

## 2023-09-16 MED ORDER — AMOXICILLIN-POT CLAVULANATE 875-125 MG PO TABS: 1.0000 | ORAL_TABLET | Freq: Two times a day (BID) | ORAL | 0 refills | Status: DC

## 2023-09-16 NOTE — ED Provider Notes (Signed)
 [facility]    CSN: [id number] Arrival date & time: 09/16/23  1115      History   Chief Complaint Chief Complaint  Patient presents with   Dental Pain    HPI [facility] is a 38 y.o. male.    Dental Pain  Patient is here for dental pain.  Had a left upper tooth extraction about 1 week ago and having pain at the site.  The pain started the day after, and having some swelling to the site.  This was done in [place], and did not call them.  No fevers/chills.  No n/v.      Past Medical History:  Diagnosis Date   Schizophrenia [facility])     Patient Active Problem List   Diagnosis Date Noted   Paranoid schizophrenia (HCC) 10/22/2011   Polydipsia 10/13/2011   Adynamia 10/13/2011   H/O anxiety state 09/24/2011   H/O: depression 09/24/2011   H/O schizophrenia 09/24/2011    Past Surgical History:  Procedure Laterality Date   ELBOW SURGERY         Home Medications    Prior to Admission medications   Medication Sig Start Date End Date Taking? Authorizing Provider  benzonatate  (TESSALON ) 200 MG capsule Take 1 capsule (200 mg total) by mouth 2 (two) times daily as needed for cough. 06/05/18   Stephany Ehrich, MD  ibuprofen  (ADVIL ,MOTRIN ) 800 MG tablet Take 1 tablet (800 mg total) by mouth 3 (three) times daily. 06/05/18   Stephany Ehrich, MD  ipratropium (ATROVENT ) 0.06 % nasal spray Place 2 sprays into both nostrils 4 (four) times daily. 06/05/18   Stephany Ehrich, MD  Multiple Vitamin (MULTIVITAMIN WITH MINERALS) TABS tablet Take 1 tablet by mouth daily. Reported on 07/31/2015    [provider]    Family History Family History  Problem Relation Age of Onset   Healthy Mother    Healthy Father     Social History Social History   Tobacco Use   Smoking status: Former    Current packs/day: 0.00    Types: Cigarettes    Quit date: 05/06/2015    Years since quitting: 8.3   Smokeless tobacco: Never  Vaping Use   Vaping status: Never  Used  Substance Use Topics   Alcohol use: No   Drug use: Not Currently     Allergies   Bee venom   Review of Systems Review of Systems  Constitutional: Negative.   HENT:  Positive for dental problem.   Respiratory: Negative.    Cardiovascular: Negative.   Gastrointestinal: Negative.   Musculoskeletal: Negative.      Physical Exam Triage Vital Signs ED Triage Vitals [09/16/23 1152]  Encounter Vitals Group     BP 122/80     Systolic BP Percentile      Diastolic BP Percentile      Pulse Rate 79     Resp 16     Temp 97.7 F (36.5 C)     Temp Source Oral     SpO2 97 %     Weight      Height      Head Circumference      Peak Flow      Pain Score 5     Pain Loc      Pain Education      Exclude from Growth Chart    No data found.  Updated Vital Signs BP 122/80 (BP Location: Left Arm)   Pulse 79   Temp  97.7 F (36.5 C) (Oral)   Resp 16   SpO2 97%   Visual Acuity Right Eye Distance:   Left Eye Distance:   Bilateral Distance:    Right Eye Near:   Left Eye Near:    Bilateral Near:     Physical Exam Constitutional:      Appearance: Normal appearance. He is normal weight.  HENT:     Head:     Comments: TTP to the left maxillary area;  no jaw pain/tenderness    Mouth/Throat:     Mouth: Mucous membranes are moist. No oral lesions.     Comments: S/p tooth extraction to left upper molar;  no obvious swelling or drainage noted from this site;  area is tender Neurological:     Mental Status: He is alert.      UC Treatments / Results  Labs (all labs ordered are listed, but only abnormal results are displayed) Labs Reviewed - No data to display  EKG   Radiology No results found.  Procedures Procedures (including critical care time)  Medications Ordered in UC Medications - No data to display  Initial Impression / Assessment and Plan / UC Course  I have reviewed the triage vital signs and the nursing notes.  Pertinent labs & imaging results  that were available during my care of the patient were reviewed by me and considered in my medical decision making (see chart for details).    Final Clinical Impressions(s) / UC Diagnoses   Final diagnoses:  Pain, dental     Discharge Instructions      You were seen today for dental pain after tooth extraction.  I have sent out an oral antibiotic to help with any possible infection.  If this is not improving in the next 24-48 hrs please follow up with your dentist.  I recommend warm salt water rinses as well.     ED Prescriptions     Medication Sig Dispense Auth. Provider   amoxicillin -clavulanate (AUGMENTIN ) 875-125 MG tablet Take 1 tablet by mouth every 12 (twelve) hours. 14 tablet Lesle Ras, MD      PDMP not reviewed this encounter.   Lesle Ras, MD 09/16/23 1209

## 2023-09-16 NOTE — ED Triage Notes (Signed)
 Patient reports that he had a toot extraction 6 days ago tot he left upper. Patient states he was not provided antibiotics and has had left upper dental pain where the extraction was.

## 2023-09-16 NOTE — Discharge Instructions (Signed)
 You were seen today for dental pain after tooth extraction.  I have sent out an oral antibiotic to help with any possible infection.  If this is not improving in the next 24-48 hrs please follow up with your dentist.  I recommend warm salt water rinses as well.

## 2023-09-23 ENCOUNTER — Telehealth (HOSPITAL_COMMUNITY): Payer: Self-pay

## 2023-09-23 MED ORDER — AMOXICILLIN-POT CLAVULANATE 875-125 MG PO TABS: 1.0000 | ORAL_TABLET | Freq: Two times a day (BID) | ORAL | 0 refills | Status: AC

## 2023-09-23 NOTE — Telephone Encounter (Signed)
 Per patient access, "hi this pt called and said that he left is antibiotic in a hotel and he would like to know what he should do about getting another prescription number is [phone] "  I spoke with pt, and he reports he took 3 full days of the medication. Can we send him the additional pills? I have updated his pharmacy. Thanks

## 2023-09-23 NOTE — Telephone Encounter (Signed)
 Rx sent to pharmacy on file.

## 2023-12-02 ENCOUNTER — Ambulatory Visit (HOSPITAL_COMMUNITY)
Admission: EM | Admit: 2023-12-02 | Discharge: 2023-12-02 | Disposition: A | Discharge status: Home / Self Care | Attending: Family Medicine | Admitting: Family Medicine

## 2023-12-02 ENCOUNTER — Ambulatory Visit (INDEPENDENT_AMBULATORY_CARE_PROVIDER_SITE_OTHER)

## 2023-12-02 ENCOUNTER — Encounter (HOSPITAL_COMMUNITY): Payer: Self-pay

## 2023-12-02 DIAGNOSIS — R079 Chest pain, unspecified: Secondary | ICD-10-CM

## 2023-12-02 DIAGNOSIS — R0602 Shortness of breath: Secondary | ICD-10-CM

## 2023-12-02 DIAGNOSIS — R0789 Other chest pain: Secondary | ICD-10-CM

## 2023-12-02 NOTE — ED Triage Notes (Signed)
 Patient here today with c/o some irritation around right lung n3-4 weeks ago after staying at a friend's house. Patient noticed for 2 weeks after that he had some loss of appetite and rapid breathing. Patient feels like the symptoms are worsening. Patient is concerned with possible mold.

## 2023-12-02 NOTE — Discharge Instructions (Signed)
 Your blood pressure was noted to be elevated during your visit today. If you are currently taking medication for high blood pressure, please ensure you are taking this as directed. If you do not have a history of high blood pressure and your blood pressure remains persistently elevated, you may need to begin taking a medication at some point. You may return here within the next few days to recheck if unable to see your primary care provider or if you do not have a one.  BP (!) 146/109 (BP Location: Left Arm)   Pulse 93   Temp 98.2 F (36.8 C) (Oral)   Resp 16   SpO2 96%   BP Readings from Last 3 Encounters:  12/02/23 (!) 146/109  09/16/23 122/80  06/05/18 137/69

## 2023-12-05 NOTE — ED Provider Notes (Signed)
 [facility]   [id number] 12/02/23 Arrival Time: 1816  ASSESSMENT & PLAN:  1. SOB (shortness of breath)   2. Chest tightness    Declines ED evaluation at this time. Normal PE here. Denies current symptoms.  ECG: Performed today and interpreted by me: normal EKG, normal sinus rhythm.  I have personally viewed and independently interpreted the imaging studies ordered this visit. CXR: no acute changes; no pneumothorax.  Unclear cause of his symptoms.   Follow-up Information     [facility] [place].   Specialty: Emergency Medicine Why: If symptoms worsen in any way. Contact information: [address] [place]  [phone]                 Reviewed expectations re: course of current medical issues. Questions answered. Outlined signs and symptoms indicating need for more acute intervention. Patient verbalized understanding. After Visit Summary given.   SUBJECTIVE:  History from: patient. [facility] is a 38 y.o. male who presents with complaint some irritation around my right lung; with occas feeling of SOB/chest tightness that is poorly described. Noted intermittently over past 3-4 weeks after staying at friend's house. He questions mold exposure. Here with out any symptoms. Non-smoker. Denies recent illness/fever. No tx PTA. Denies recreational drug use.  Social History   Tobacco Use  Smoking Status Former   Current packs/day: 0.00   Types: Cigarettes   Quit date: 05/06/2015   Years since quitting: 8.5  Smokeless Tobacco Never   Social History   Substance and Sexual Activity  Alcohol Use No   OBJECTIVE:  Vitals:   12/02/23 1907  BP: (!) 146/109  Pulse: 93  Resp: 16  Temp: 98.2 F (36.8 C)  TempSrc: Oral  SpO2: 96%    General appearance: alert, oriented, no acute distress Eyes: PERRLA; EOMI; conjunctivae normal HENT: normocephalic; atraumatic Neck: supple  with FROM Lungs: without labored respirations; speaks full sentences without difficulty; CTAB Heart: regular rate and rhythm without murmer Chest Wall: without tenderness to palpation Abdomen: soft, non-tender; no guarding or rebound tenderness Extremities: without edema; without calf swelling or tenderness; symmetrical without gross deformities Skin: warm and dry; without rash or lesions Neuro: normal gait Psychological: alert and cooperative; normal mood and affect  Labs:  Labs Reviewed - No data to display  Imaging: No results found.   Allergies  Allergen Reactions   Bee Venom Swelling    Past Medical History:  Diagnosis Date   Schizophrenia (HCC)    Social History   Socioeconomic History   Marital status: Single    Spouse name: Not on file   Number of children: Not on file   Years of education: Not on file   Highest education level: Not on file  Occupational History   Not on file  Tobacco Use   Smoking status: Former    Current packs/day: 0.00    Types: Cigarettes    Quit date: 05/06/2015    Years since quitting: 8.5   Smokeless tobacco: Never  Vaping Use   Vaping status: Never Used  Substance and Sexual Activity   Alcohol use: No   Drug use: Not Currently   Sexual activity: Not on file  Other Topics Concern   Not on file  Social History Narrative   Not on file   Social Drivers of Health   Financial Resource Strain: Not on file  Food Insecurity: Not on file  Transportation Needs: Not on file  Physical Activity:  Not on file  Stress: Not on file  Social Connections: Not on file  Intimate Partner Violence: Not on file   Family History  Problem Relation Age of Onset   Healthy Mother    Healthy Father    Past Surgical History:  Procedure Laterality Date   ELBOW SURGERY        Rolinda Rogue, MD 12/05/23 1257
